# Patient Record
Sex: Female | Born: 1985 | Race: White | Hispanic: No | Marital: Married | Smoking: Never smoker
Health system: Southern US, Community
[De-identification: ages and names within clinical notes are randomized; demographics above are authoritative.]

## PROBLEM LIST (undated history)

## (undated) DIAGNOSIS — R002 Palpitations: Secondary | ICD-10-CM

## (undated) DIAGNOSIS — F410 Panic disorder [episodic paroxysmal anxiety] without agoraphobia: Secondary | ICD-10-CM

## (undated) DIAGNOSIS — D649 Anemia, unspecified: Secondary | ICD-10-CM

## (undated) HISTORY — DX: Anemia, unspecified: D64.9

## (undated) HISTORY — PX: WISDOM TOOTH EXTRACTION: SHX21

---

## 2007-12-29 ENCOUNTER — Ambulatory Visit: Payer: Self-pay | Admitting: Obstetrics and Gynecology

## 2008-06-08 IMAGING — US US OB US >=[ID] SNGL FETUS
1 series · 17 of 28 positions shown · non-contrast
Comparison: none

REASON FOR EXAM: 18 weeks pregnant, spotting
COMMENTS:

[Series 1: us ob us >=(id) sngl fetus · 17 of 43 slices shown]
[im 1/43]
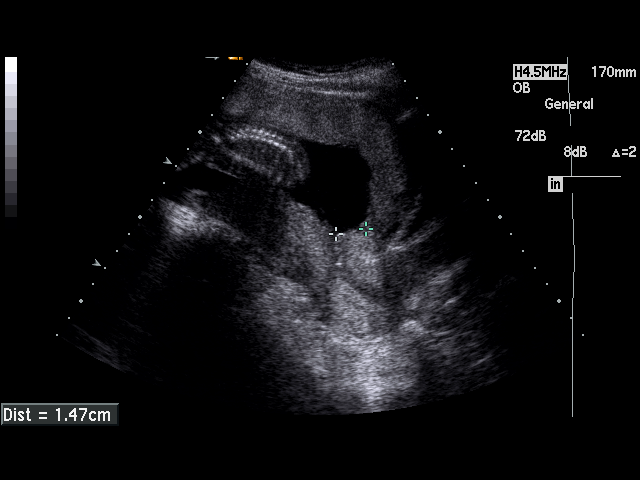
[im 4/43]
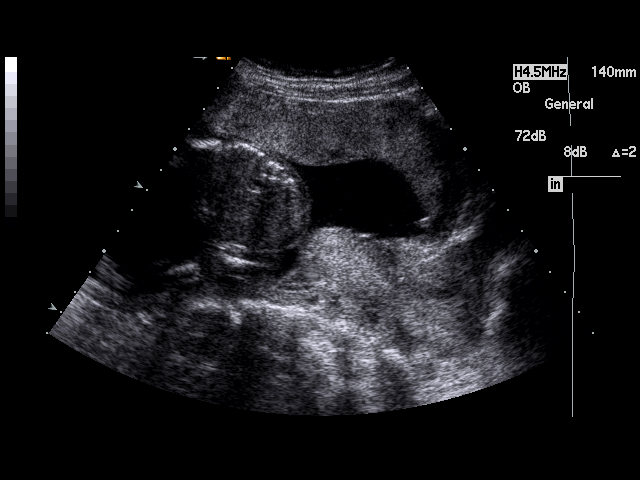
[im 7/43]
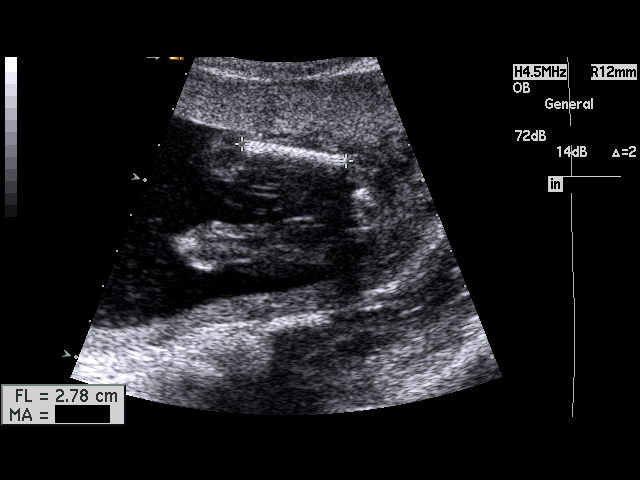
[im 8/43]
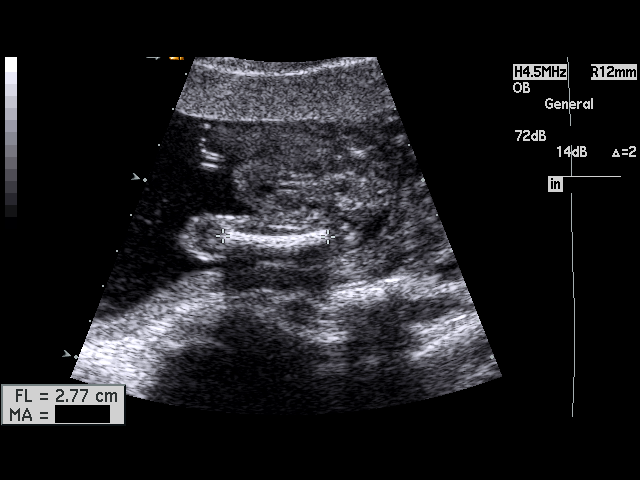
[im 11/43]
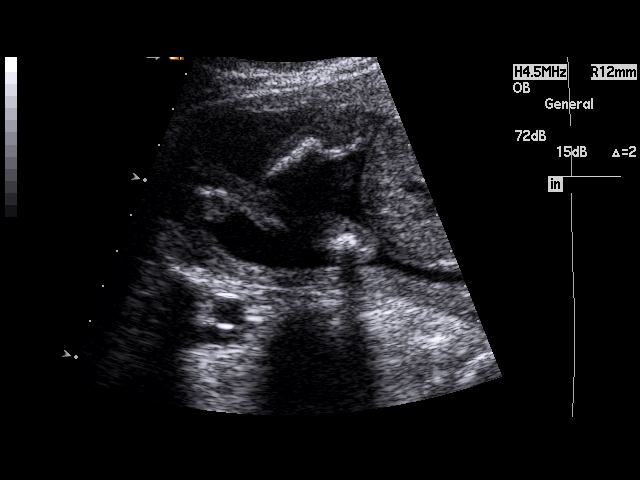
[im 15/43]
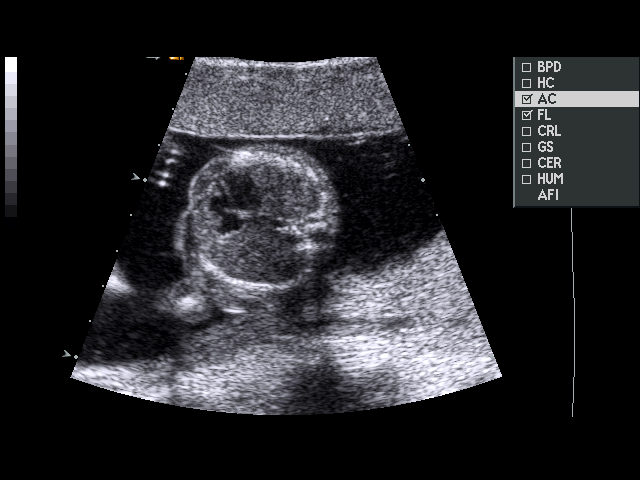
[im 16/43]
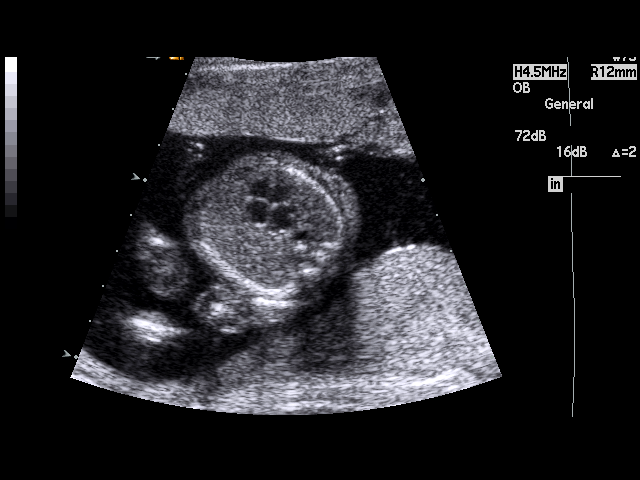
[im 19/43]
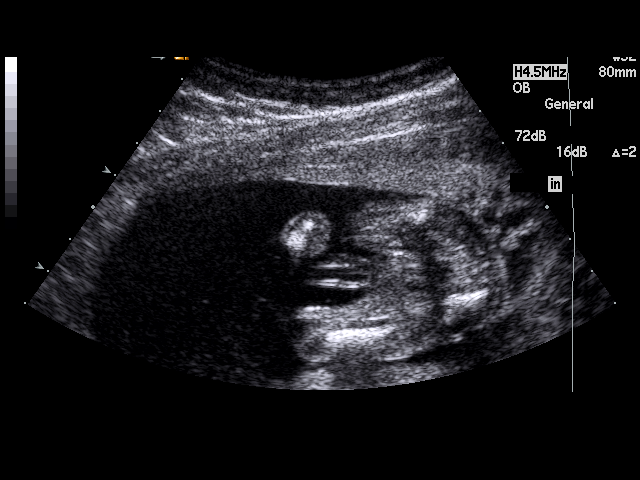
[im 22/43]
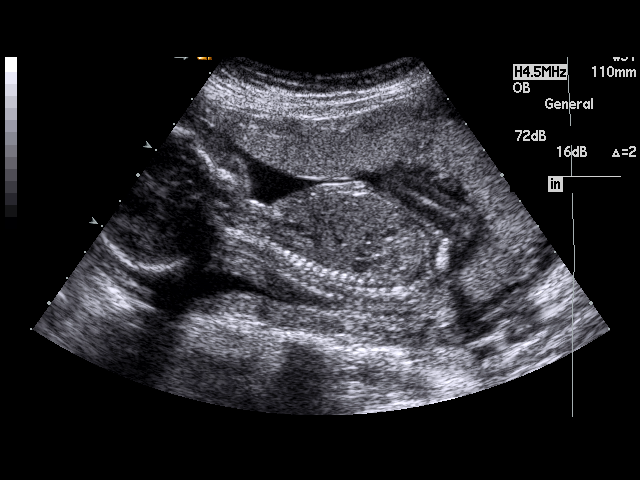
[im 24/43]
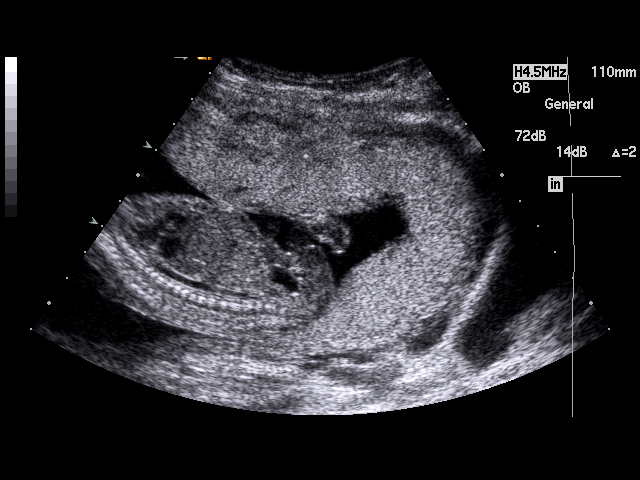
[im 27/43]
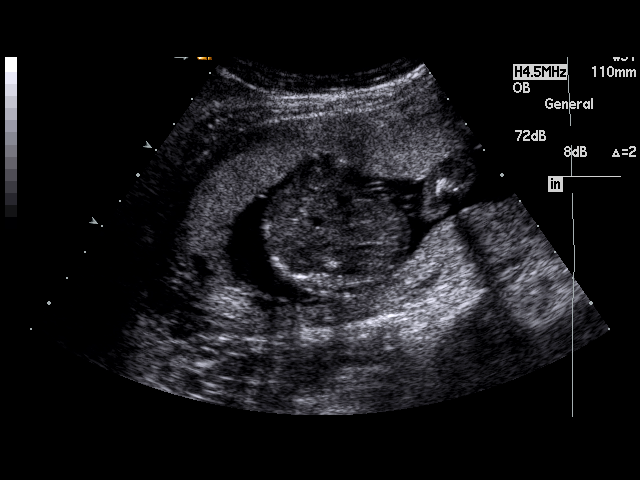
[im 29/43]
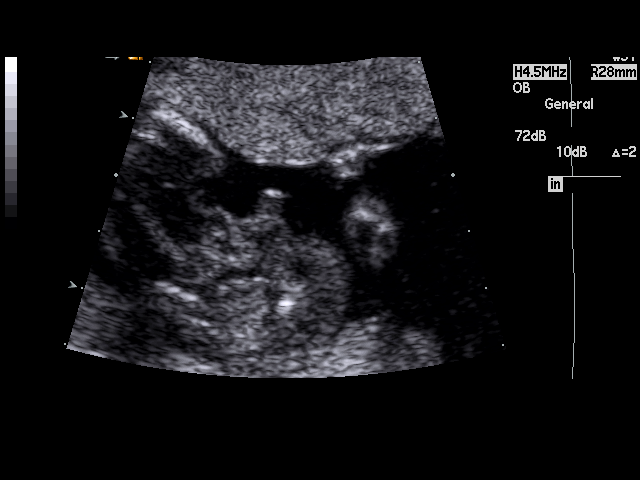
[im 32/43]
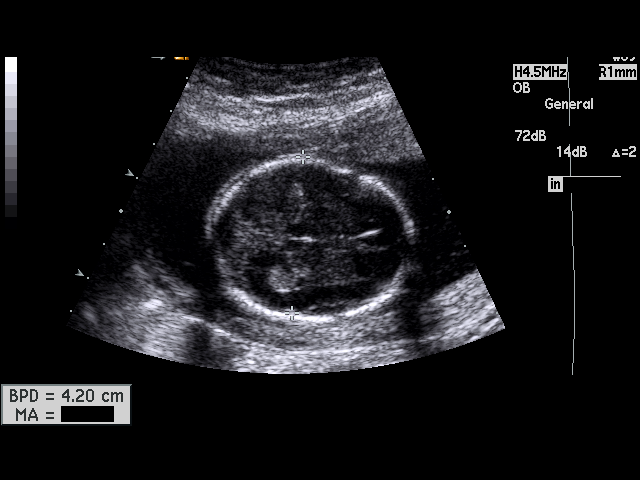
[im 35/43]
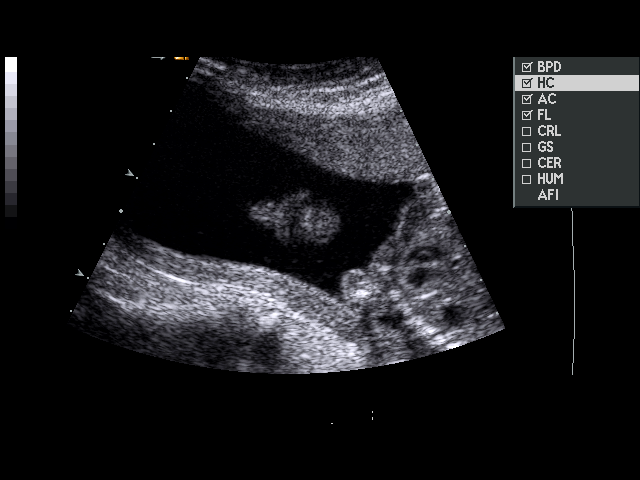
[im 36/43]
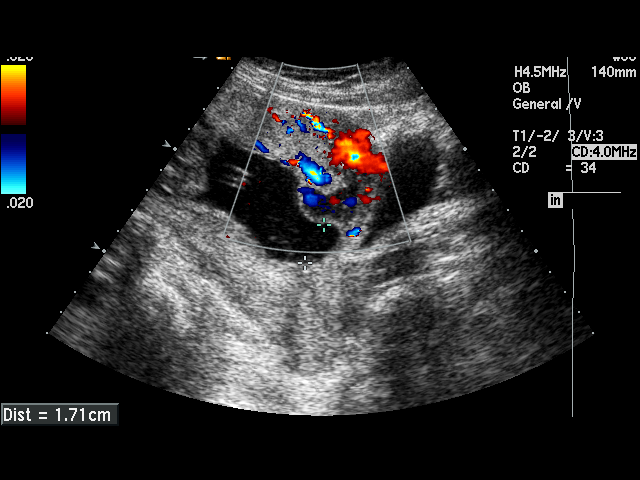
[im 39/43]
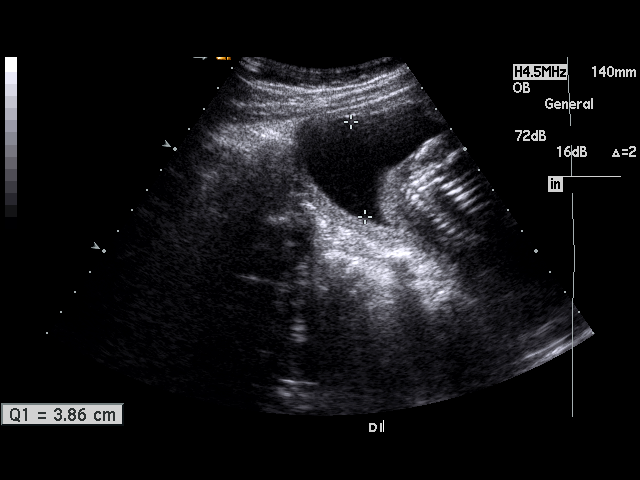
[im 43/43]
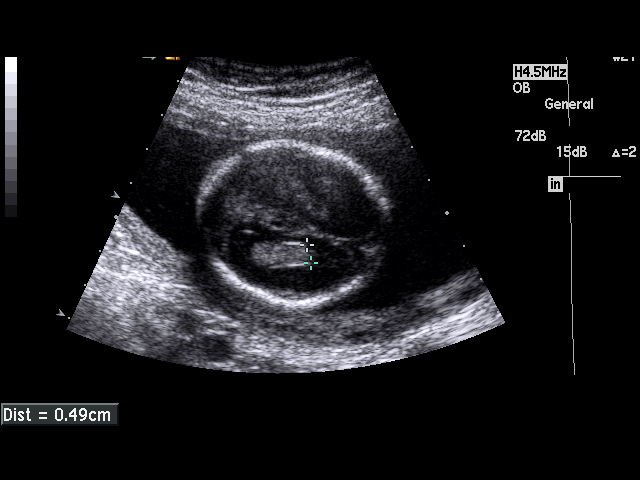

[17 of 28 positions shown; findings below may reference images not displayed]

PROCEDURE:     US  - US OB GREATER/OR EQUAL TO HXREA  - December 29, 2007 [DATE]

RESULT:     A single, viable intrauterine pregnancy is appreciated with
estimated fetal heart rate of 150 beats per minute. Cardiac, trunk,
extremity and diaphragmatic movement are appreciated. Evaluation of the
fetal anatomy demonstrates no bladder, renal, stomach, cardiac, diaphragm,
spine or ventricular abnormalities. Amniotic fluid appears unremarkable with
an index of 14.8. A Grade II placenta is appreciated which appears be
low-lying and appears to lie approximately 1.5 cm from the cervical os. No
pelvic masses, free fluid or loculated fluid collections are identified.

Fetal biopsy:

     BPD:  4.25 cm, corresponding to EGA of 18 weeks, 6 days

       HC: 15.89 cm, corresponding to EGA of 18 weeks, 5 days

       AC: 13.41 cm, corresponding to EGA of 18 weeks, 6 days

       FL:    2.27 cm, corresponding to EGA of 18 weeks, 3 days

Estimated fetal weight is 278 grams plus or minus 37 grams which corresponds
to 10 ounces plus or minus 1 ounce.
IMPRESSION: 1.     Single, viable intrauterine pregnancy as described above. There are
findings which appear to reflect the sequelae of a low lying placenta.
2.     Estimated gestational age is 18 weeks, 5 days with EDC of 05/26/2008.

## 2017-04-09 DIAGNOSIS — D509 Iron deficiency anemia, unspecified: Secondary | ICD-10-CM | POA: Insufficient documentation

## 2017-12-16 ENCOUNTER — Encounter (INDEPENDENT_AMBULATORY_CARE_PROVIDER_SITE_OTHER): Payer: Self-pay

## 2017-12-16 ENCOUNTER — Telehealth: Payer: Self-pay

## 2017-12-16 ENCOUNTER — Encounter: Payer: Self-pay | Admitting: Gastroenterology

## 2017-12-16 ENCOUNTER — Ambulatory Visit: Payer: Medicaid Other | Admitting: Gastroenterology

## 2017-12-16 ENCOUNTER — Other Ambulatory Visit: Payer: Self-pay

## 2017-12-16 VITALS — BP 118/80 | HR 75 | Temp 97.7°F | Ht 66.0 in | Wt 140.8 lb

## 2017-12-16 DIAGNOSIS — K642 Third degree hemorrhoids: Secondary | ICD-10-CM

## 2017-12-16 DIAGNOSIS — O99019 Anemia complicating pregnancy, unspecified trimester: Secondary | ICD-10-CM | POA: Insufficient documentation

## 2017-12-16 DIAGNOSIS — Z98891 History of uterine scar from previous surgery: Secondary | ICD-10-CM | POA: Insufficient documentation

## 2017-12-16 DIAGNOSIS — D508 Other iron deficiency anemias: Secondary | ICD-10-CM

## 2017-12-16 DIAGNOSIS — Z641 Problems related to multiparity: Secondary | ICD-10-CM | POA: Insufficient documentation

## 2017-12-16 DIAGNOSIS — K644 Residual hemorrhoidal skin tags: Secondary | ICD-10-CM

## 2017-12-16 MED ORDER — HYDROCORTISONE 2.5 % RE CREA: 1.0000 "application " | TOPICAL_CREAM | Freq: Two times a day (BID) | RECTAL | 1 refills | Status: DC

## 2017-12-16 MED ORDER — NONFORMULARY OR COMPOUNDED ITEM: 2 refills | Status: DC

## 2017-12-16 NOTE — Telephone Encounter (Signed)
LVM informing pt of her labs to be completed at any [facility] patient station.  She does not have to be fasting.  Thanks [employer]

## 2017-12-16 NOTE — Progress Notes (Signed)
Cephas Darby, MD [address]  [place], [place] [address]  Main: [phone]  Fax: [phone]    Gastroenterology Consultation  Referring Provider:     Dionne Ano, CNM Primary Care Physician:  Dionne Ano, CNM Primary Gastroenterologist:  Dr. Cephas Darby Reason for Consultation:     Symptomatic hemorrhoids        HPI:   Sherri Fitzgerald is a 32 y.o. female referred by Dr. Berniece Andreas, Royetta Crochet, CNM  for consultation & management of symptomatic hemorrhoids. She has history of severe iron deficiency anemia secondary to pregnancy that required iron transfusions. Her last hemoglobin was checked in 03/2017 by her OB/GYN which was 11 and ferritin was 142. She has 6 children, youngest child is an 16 months old boy. She reports that over several years, during her pregnancies she had worsening of hemorrhoids, symptoms including itching, burning, severe pain, prolapse, bleeding. She reports that her hemorrhoids prolapse every time she has a bowel movement and reduce by changing her position to Trendelenburg. Since her last pregnancy, she reports that her hemorrhoids have gotten worse. She is not planning to get pregnant anymore and decided to get her hemorrhoids taken care of. She researched about [facility] banding and found that our office offers it. She denies constipation, takes stool softeners on a regular basis and watchful about her diet. She does sitz baths, witch hazel pads, has tried over-the-counter hydrocortisone cream intermittently. She reports having one external hemorrhoid that has been extremely painful lately. 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 She otherwise denies any GI symptoms.  NSAIDs: None  Antiplts/Anticoagulants/Anti thrombotics: None  GI Procedures: None Grandmother with colon cancer at age 51 Denies any history of GI malignancy in first-degree relatives She did not have any GI surgeries  History reviewed. No pertinent past medical  history.  History reviewed. No pertinent surgical history.  Prior to Admission medications   Medication Sig Start Date End Date Taking? Authorizing Provider  PARAGARD INTRAUTERINE COPPER IUD IUD ParaGard T 380A 380 square mm intrauterine device  Take 1 device by intrauterine route.   Yes [provider]  hydrocortisone (ANUSOL-HC) 2.5 % rectal cream Place 1 application rectally 2 (two) times daily. 12/16/17   Lin Landsman, MD  NONFORMULARY OR COMPOUNDED ITEM Nitroglycerin 0.125% Ointment compounded with 5% Lidocaine Sig: apply pea size amount to the rectum 3-4 times per day as directed 12/16/17   Lin Landsman, MD    History reviewed. No pertinent family history.   Social History   Tobacco Use  . Smoking status: Never Smoker  . Smokeless tobacco: Never Used  Substance Use Topics  . Alcohol use: Yes    Frequency: Never    Comment: ocassionally  . Drug use: No    Allergies as of 12/16/2017  . (No Known Allergies)    Review of Systems:    All systems reviewed and negative except where noted in HPI.   Physical Exam:  BP 118/80   Pulse 75   Temp 97.7 F (36.5 C) (Oral)   Ht 5\' 6"  (1.676 m)   Wt 140 lb 12.8 oz (63.9 kg)   BMI 22.73 kg/m  No LMP recorded.  General:   Alert,  Well-developed, well-nourished, pleasant and cooperative in NAD Head:  Normocephalic and atraumatic. Eyes:  Sclera clear, no icterus.   Conjunctiva pink. Ears:  Normal auditory acuity. Nose:  No deformity, discharge, or lesions. Mouth:  No deformity or  lesions,oropharynx pink & moist. Neck:  Supple; no masses or thyromegaly. Lungs:  Respirations even and unlabored.  Clear throughout to auscultation.   No wheezes, crackles, or rhonchi. No acute distress. Heart:  Regular rate and rhythm; no murmurs, clicks, rubs, or gallops. Abdomen:  Normal bowel sounds. Soft, non-tender and non-distended without masses, hepatosplenomegaly or hernias noted.  No guarding or rebound tenderness.    Rectal: Perianal exam revealed skin tags and one external hemorrhoid that appeared inflamed but not thrombosed. Digital rectal exam did not reveal anal fissure. Anoscopy was performed which revealed grade 3 RA and RP internal hemorrhoid, grade 2 LL hemorrhoid. There was mucus in the rectal vault, the mucosa did not appear inflamed Msk:  Symmetrical without gross deformities. Good, equal movement & strength bilaterally. Pulses:  Normal pulses noted. Extremities:  No clubbing or edema.  No cyanosis. Neurologic:  Alert and oriented x3;  grossly normal neurologically. Skin:  Intact without significant lesions or rashes. No jaundice. Psych:  Alert and cooperative. Normal mood and affect.  Imaging Studies: None  Assessment and Plan:   Sherri Fitzgerald is a 32 y.o. White female multi parous, iron deficiency anemia secondary to multiple pregnancies status post IV iron, has been dealing with symptomatic grade 3 hemorrhoids, seen in consultation for treatment of her hemorrhoids  - I discussed with her today about [facility] hemorrhoid banding and she agreed to undergo the procedure in 3 sessions - Consent obtained - She will continue Anusol cream 2 times daily as well as 0.125 nitroglycerin +5% lidocaine as directed - Continue sitz baths daily - Recommended to use a donut while sitting to relieve pressure from the perianal area    Follow up in 2 weeks  Cephas Darby, MD

## 2017-12-16 NOTE — Progress Notes (Signed)
PROCEDURE NOTE: The patient presents with symptomatic grade 3 hemorrhoids, unresponsive to maximal medical therapy, requesting rubber band ligation of her hemorrhoidal disease.  All risks, benefits and alternative forms of therapy were described and informed consent was obtained.  In the Left Lateral Decubitus position anoscopic examination revealed grade 3 hemorrhoids in the all position(s).   The decision was made to band the RA internal hemorrhoid, and the Section was used to perform band ligation without complication.  Digital anorectal examination was then performed to assure proper positioning of the band, and to adjust the banded tissue as required.  The patient was discharged home without pain or other issues.  Dietary and behavioral recommendations were given and (if necessary - prescriptions were given), along with follow-up instructions.  The patient will return 2 weeks for follow-up and possible additional banding as required.  No complications were encountered and the patient tolerated the procedure well.   Cephas Darby, MD [address]  [place]  [place], [place] [address]  Main: [phone]  Fax: [phone] Pager: [phone]

## 2017-12-18 ENCOUNTER — Telehealth: Payer: Self-pay | Admitting: Gastroenterology

## 2017-12-18 NOTE — Telephone Encounter (Signed)
Patient returned you call and has some questions on Labs and her procedure. Please call her back.

## 2017-12-21 ENCOUNTER — Telehealth: Payer: Self-pay

## 2017-12-21 NOTE — Telephone Encounter (Signed)
Retuned pts call LVM to discuss her questions regarding labs.

## 2017-12-22 ENCOUNTER — Other Ambulatory Visit: Payer: Self-pay

## 2017-12-22 ENCOUNTER — Telehealth: Payer: Self-pay

## 2017-12-22 DIAGNOSIS — D508 Other iron deficiency anemias: Secondary | ICD-10-CM

## 2017-12-22 NOTE — Telephone Encounter (Signed)
Lab orders sent to [facility] per patient request

## 2017-12-28 ENCOUNTER — Telehealth: Payer: Self-pay | Admitting: Gastroenterology

## 2017-12-28 NOTE — Telephone Encounter (Signed)
I called & spoke to patient to let Sherri Fitzgerald know it was ok she hasn't had Sherri Fitzgerald labs,per Sharyn Lull. She would be able to discuss futher with Dr Marius Ditch at Sherri Fitzgerald appointment.

## 2017-12-28 NOTE — Telephone Encounter (Signed)
PATIENT CALLED AN WAS VERY CONCERNED.THE ORDERS FOR THE LAB WORK HAVE BEEN ENTERED,HOWEVER SHE IS SELF PAY AND DOES NOT HAVE THE MONEY AT PRESENT.SHE WOULD LIKE TO KNOW HOW VITAL IT IS TO GET THEM NOW OR CAN SHE WAIT?.PLEASE CALL PATIENT. SHE HAS A RETURN VISIT WITH DR Marius Ditch ON 01-04-18.

## 2018-01-04 ENCOUNTER — Encounter: Payer: Self-pay | Admitting: Gastroenterology

## 2018-01-04 ENCOUNTER — Ambulatory Visit (INDEPENDENT_AMBULATORY_CARE_PROVIDER_SITE_OTHER): Payer: Medicaid Other | Admitting: Gastroenterology

## 2018-01-04 VITALS — BP 105/70 | HR 66 | Temp 97.8°F | Ht 66.0 in | Wt 142.8 lb

## 2018-01-04 DIAGNOSIS — K642 Third degree hemorrhoids: Secondary | ICD-10-CM

## 2018-01-04 NOTE — Progress Notes (Signed)
PROCEDURE NOTE: The patient presents with symptomatic grade 3 hemorrhoids, unresponsive to maximal medical therapy, requesting rubber band ligation of his/her hemorrhoidal disease.  All risks, benefits and alternative forms of therapy were described and informed consent was obtained.  The decision was made to band the RP internal hemorrhoid, and the Garvin was used to perform band ligation without complication.  Digital anorectal examination was then performed to assure proper positioning of the band, and to adjust the banded tissue as required.  The patient was discharged home without pain or other issues.  Dietary and behavioral recommendations were given and (if necessary - prescriptions were given), along with follow-up instructions.  The patient will return 2 weeks for follow-up and possible additional banding as required. Initial attempt to deploy the band was unsuccessful. I have reattempted to perform ligation and deployed successfully.  No complications were encountered and the patient tolerated the procedure well.  Cephas Darby, MD [address]  [place]  [place], [place] [address]  Main: [phone]  Fax: [phone] Pager: [phone]

## 2018-01-04 NOTE — Progress Notes (Signed)
Cephas Darby, MD [address]  [place]  [place], [place] [address]  Main: [phone]  Fax: [phone]    Gastroenterology Consultation  Referring Provider:     No ref. provider found Primary Care Physician:  Dionne Ano, CNM Primary Gastroenterologist:  Dr. Cephas Darby Reason for Consultation:     Symptomatic hemorrhoids        HPI:   Sherri Fitzgerald is a 32 y.o. female referred by Dr. Berniece Andreas, Royetta Crochet, CNM  for consultation & management of symptomatic hemorrhoids. She has history of severe iron deficiency anemia secondary to pregnancy that required iron transfusions. Her last hemoglobin was checked in 03/2017 by her OB/GYN which was 11 and ferritin was 142. She has 6 children, youngest child is an 34 months old boy. She reports that over several years, during her pregnancies she had worsening of hemorrhoids, symptoms including itching, burning, severe pain, prolapse, bleeding. She reports that her hemorrhoids prolapse every time she has a bowel movement and reduce by changing her position to Trendelenburg. Since her last pregnancy, she reports that her hemorrhoids have gotten worse. She is not planning to get pregnant anymore and decided to get her hemorrhoids taken care of. She researched about [facility] banding and found that our office offers it. She denies constipation, takes stool softeners on a regular basis and watchful about her diet. She does sitz baths, witch hazel pads, has tried over-the-counter hydrocortisone cream intermittently. She reports having one external hemorrhoid that has been extremely painful lately. 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 She otherwise denies any GI symptoms.  Follow-up visit 01/04/2018: He reports feeling significantly better since first banding. Her symptoms related to hemorrhoids are significantly better. She is regularly using topical nitroglycerin and steroid which is helping. Her bowel movements are regular with fiber  supplements. She did not get labs done from last visit  NSAIDs: None  Antiplts/Anticoagulants/Anti thrombotics: None  GI Procedures: None Grandmother with colon cancer at age 34 Denies any history of GI malignancy in first-degree relatives She did not have any GI surgeries  History reviewed. No pertinent past medical history.  History reviewed. No pertinent surgical history.  Prior to Admission medications   Medication Sig Start Date End Date Taking? Authorizing Provider  PARAGARD INTRAUTERINE COPPER IUD IUD ParaGard T 380A 380 square mm intrauterine device  Take 1 device by intrauterine route.   Yes [provider]  hydrocortisone (ANUSOL-HC) 2.5 % rectal cream Place 1 application rectally 2 (two) times daily. 12/16/17   Lin Landsman, MD  NONFORMULARY OR COMPOUNDED ITEM Nitroglycerin 0.125% Ointment compounded with 5% Lidocaine Sig: apply pea size amount to the rectum 3-4 times per day as directed 12/16/17   Lin Landsman, MD    History reviewed. No pertinent family history.   Social History   Tobacco Use  . Smoking status: Never Smoker  . Smokeless tobacco: Never Used  Substance Use Topics  . Alcohol use: Yes    Frequency: Never    Comment: ocassionally  . Drug use: No    Allergies as of 01/04/2018  . (No Known Allergies)    Review of Systems:    All systems reviewed and negative except where noted in HPI.   Physical Exam:  BP 105/70   Pulse 66   Temp 97.8 F (36.6 C) (Oral)   Ht 5\' 6"  (1.676 m)   Wt 142 lb 12.8 oz (64.8 kg)   BMI 23.05  kg/m  No LMP recorded.  General:   Alert,  Well-developed, well-nourished, pleasant and cooperative in NAD Head:  Normocephalic and atraumatic. Eyes:  Sclera clear, no icterus.   Conjunctiva pink. Ears:  Normal auditory acuity. Nose:  No deformity, discharge, or lesions. Mouth:  No deformity or lesions,oropharynx pink & moist. Neck:  Supple; no masses or thyromegaly. Lungs:  Respirations even and  unlabored.  Clear throughout to auscultation.   No wheezes, crackles, or rhonchi. No acute distress. Heart:  Regular rate and rhythm; no murmurs, clicks, rubs, or gallops. Abdomen:  Normal bowel sounds. Soft, non-tender and non-distended without masses, hepatosplenomegaly or hernias noted.  No guarding or rebound tenderness.   Rectal: Perianal exam revealed skin tags and one external hemorrhoid that appeared less inflamed ompared to last visit Digital rectal exam did not reveal anal fissure.  Msk:  Symmetrical without gross deformities. Good, equal movement & strength bilaterally. Pulses:  Normal pulses noted. Extremities:  No clubbing or edema.  No cyanosis. Neurologic:  Alert and oriented x3;  grossly normal neurologically. Skin:  Intact without significant lesions or rashes. No jaundice. Psych:  Alert and cooperative. Normal mood and affect.  Imaging Studies: None  Assessment and Plan:   Sherri Fitzgerald is a 32 y.o. White female multi parous, iron deficiency anemia secondary to multiple pregnancies status post IV iron, has been dealing with symptomatic grade 3 hemorrhoids here for follow-up of treatment of her hemorrhoids  - She will continue Anusol cream 2 times daily as well as 0.125 nitroglycerin +5% lidocaine as directed - Continue sitz baths daily - Recommended to use a donut while sitting to relieve pressure from the perianal area  - continue fiber supplements - Perform hemorrhoid ligation today   Follow up in 2 weeks  Cephas Darby, MD

## 2018-01-20 ENCOUNTER — Telehealth: Payer: Self-pay | Admitting: Gastroenterology

## 2018-01-20 NOTE — Telephone Encounter (Signed)
Pt left vm for a nurse to call back she has some medical questions about her condition and wants some answers before scheduling a 3rd banding

## 2018-01-21 NOTE — Telephone Encounter (Signed)
Refer to colorectal surgery either [facility] or [facility]

## 2018-01-21 NOTE — Telephone Encounter (Signed)
Dr. Marius Ditch, patient contacted office with concerns regarding skintag and banding. Her concerns are as follows:  1. Skin tag is causing significant discomfort effecting her work life.  2. 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  She has had to adjust during the day to standing up at times due to the discomfort.  3. She has expressed the concern of being anxious about going forth with a third banding session because she had experienced such pain and discomfort after the process.    4. Also expressed that she experiences pain and discomfort in the vaginal area and radiating down her leg.  She states she feels anxious about doing another banding session, and mentioned that she thought it would take care of the external hemorrhoid not internal.  Contributes most of her pain to the skin tag (thinks maybe its on a nerve).  She has been applying NTG as prescribed.  Its not helping.  Can we do a referral to remove the skintag?  If so where would you like me to send her.  Please advise. Thanks [employer]

## 2018-01-22 NOTE — Telephone Encounter (Signed)
Patients referral was received  Yesterday afternoon.  Notified patient referral has been sent.  Spoke with Judson Roch to confirm referral was received.  She said yes and they will contact patient today.  Thanks [facility] [phone] Fax [phone]

## 2018-01-22 NOTE — Telephone Encounter (Signed)
Referral was sent yesterday at [facility] to  [facility].  Patient was notified.  Spoke with Judson Roch (New patient coordinator) this am.  Fax was received and they will contact patient today.

## 2018-03-18 ENCOUNTER — Telehealth: Payer: Self-pay | Admitting: Gastroenterology

## 2018-03-18 NOTE — Telephone Encounter (Signed)
Pt left vm to get refill on ointment rx nitrocl  She only had 2 refills and is almost done with second one please call [facility] to approve refill

## 2018-03-19 NOTE — Telephone Encounter (Signed)
LVM to notify patient her rx refill has been sent to pharmacy.  Thanks [employer]

## 2018-04-20 ENCOUNTER — Encounter: Payer: Self-pay | Admitting: Gastroenterology

## 2018-04-20 ENCOUNTER — Ambulatory Visit (INDEPENDENT_AMBULATORY_CARE_PROVIDER_SITE_OTHER): Payer: Medicaid Other | Admitting: Gastroenterology

## 2018-04-20 VITALS — BP 118/81 | HR 80 | Ht 66.0 in | Wt 142.6 lb

## 2018-04-20 DIAGNOSIS — K642 Third degree hemorrhoids: Secondary | ICD-10-CM

## 2018-04-20 NOTE — Progress Notes (Signed)
Cephas Darby, MD [address]  [place]  [place], [place] [address]  Main: [phone]  Fax: [phone]    Gastroenterology Consultation  Referring Provider:     No ref. provider found Primary Care Physician:  Dionne Ano, CNM Primary Gastroenterologist:  Dr. Cephas Darby Reason for Consultation:     Symptomatic hemorrhoids        HPI:   Sherri Fitzgerald is a 32 y.o. female referred by Dr. Berniece Andreas, Royetta Crochet, CNM  for consultation & management of symptomatic hemorrhoids. She has history of severe iron deficiency anemia secondary to pregnancy that required iron transfusions. Her last hemoglobin was checked in 03/2017 by her OB/GYN which was 11 and ferritin was 142. She has 6 children, youngest child is an 28 months old boy. She reports that over several years, since age of 32, during her pregnancies she had worsening of hemorrhoids, symptoms including itching, burning, severe pain, prolapse, bleeding. She reports that her hemorrhoids prolapse every time she has a bowel movement and reduce by changing her position to Trendelenburg. Since her last pregnancy, she reports that her hemorrhoids have gotten worse. She is not planning to get pregnant anymore and decided to get her hemorrhoids taken care of. She researched about [facility] banding and found that our office offers it. She denies constipation, takes stool softeners on a regular basis and watchful about her diet. She does sitz baths, witch hazel pads, has tried over-the-counter hydrocortisone cream intermittently. She reports having one external hemorrhoid that has been extremely painful lately. 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 She otherwise denies any GI symptoms.  Follow-up visit 01/04/2018: She reports feeling significantly better since first banding. Her symptoms related to hemorrhoids are significantly better. She is regularly using topical nitroglycerin and steroid which is helping. Her bowel movements are regular  with fiber supplements. She did not get labs done from last visit  Follow-up visit 04/20/2018 She reports ongoing symptoms related to hemorrhoids. She preferred to be seen by colorectal surgeon for removal of skin tag which she thought was extremely painful contributing to her majority of the symptoms. I referred her to [facility] surgery and he was given an option of surgical removal. She does not have insurance, is self-pay and so she wanted to wait. She is here to discuss about further banding procedure. She showed me the picture of prolapsed hemorrhoids with every bowel movement which she has to manually reduce takes at least 5-6 minutes time. She continues to apply nitroglycerin one to 2 times daily   NSAIDs: None  Antiplts/Anticoagulants/Anti thrombotics: None  GI Procedures: None Grandmother with colon cancer at age 3 Denies any history of GI malignancy in first-degree relatives She did not have any GI surgeries  History reviewed. No pertinent past medical history.  History reviewed. No pertinent surgical history.  Current Outpatient Medications:  .  hydrocortisone (ANUSOL-HC) 2.5 % rectal cream, Place 1 application rectally 2 (two) times daily., Disp: 30 g, Rfl: 1 .  NONFORMULARY OR COMPOUNDED ITEM, Nitroglycerin 0.125% Ointment compounded with 5% Lidocaine Sig: apply pea size amount to the rectum 3-4 times per day as directed, Disp: 1 each, Rfl: 2 .  PARAGARD INTRAUTERINE COPPER IUD IUD, ParaGard T 380A 380 square mm intrauterine device  Take 1 device by intrauterine route., Disp: , Rfl:   History reviewed. No pertinent family history.   Social History   Tobacco Use  . Smoking status: Never Smoker  .  Smokeless tobacco: Never Used  Substance Use Topics  . Alcohol use: Yes    Frequency: Never    Comment: ocassionally  . Drug use: No    Allergies as of 04/20/2018  . (No Known Allergies)    Review of Systems:    All systems reviewed and negative except where  noted in HPI.   Physical Exam:  BP 118/81   Pulse 80   Ht 5\' 6"  (1.676 m)   Wt 142 lb 9.6 oz (64.7 kg)   BMI 23.02 kg/m  No LMP recorded.  General:   Alert,  Well-developed, well-nourished, pleasant and cooperative in NAD Head:  Normocephalic and atraumatic. Eyes:  Sclera clear, no icterus.   Conjunctiva pink. Ears:  Normal auditory acuity. Nose:  No deformity, discharge, or lesions. Mouth:  No deformity or lesions,oropharynx pink & moist. Neck:  Supple; no masses or thyromegaly. Lungs:  Respirations even and unlabored.  Clear throughout to auscultation.   No wheezes, crackles, or rhonchi. No acute distress. Heart:  Regular rate and rhythm; no murmurs, clicks, rubs, or gallops. Abdomen:  Normal bowel sounds. Soft, non-tender and non-distended without masses, hepatosplenomegaly or hernias noted.  No guarding or rebound tenderness.   Rectal: Perianal exam revealed skin tags and one external hemorrhoid that appeared less inflamed ompared to last visit Digital rectal exam did not reveal anal fissure.  Msk:  Symmetrical without gross deformities. Good, equal movement & strength bilaterally. Pulses:  Normal pulses noted. Extremities:  No clubbing or edema.  No cyanosis. Neurologic:  Alert and oriented x3;  grossly normal neurologically. Skin:  Intact without significant lesions or rashes. No jaundice. Psych:  Alert and cooperative. Normal mood and affect.  Imaging Studies: None  Assessment and Plan:   Sherri Fitzgerald is a 32 y.o. White female multi parous, iron deficiency anemia secondary to multiple pregnancies status post IV iron, has been dealing with symptomatic grade 3 hemorrhoids since age of 32 here for follow-up of treatment of her hemorrhoids. She denies constipation, pushing or straining during BM  She reports that she is working with [facility] billing department and has applied for patient assistance program.She is optimistic that most of her medical expenses that are  involved in hemorrhoid ligation will be covered by [facility] and therefore would like to continue banding before pursuing hemorrhoidectomy. I have extensively discussed with her today that I may not be successfully treat her hemorrhoidal symptoms with hemorrhoid ligation given the severity of hemorrhoids. In fact, she will probably need more than 3 banding sessions in order to make a significant impact on her symptoms. She is willing to undergo hemorrhoid ligation and prefer surgery as a last resort. He reports feeling 25% better with previous banding procedures  - She will continue Anusol cream 2 times daily as well as 0.125 nitroglycerin +5% lidocaine as directed - Continue sitz baths daily - Recommended to use a donut while sitting to relieve pressure from the perianal area and squatty potty - continue fiber supplements - Perform hemorrhoid ligation today and every 2 weeks   Follow up in 2 weeks  Cephas Darby, MD

## 2018-04-20 NOTE — Progress Notes (Signed)
PROCEDURE NOTE: The patient presents with symptomatic grade 3 hemorrhoids, unresponsive to maximal medical therapy, requesting rubber band ligation of his/her hemorrhoidal disease.  All risks, benefits and alternative forms of therapy were described and informed consent was obtained.   The decision was made to band the LL internal hemorrhoid, and the [facility] O'Regan System was used to perform band ligation without complication.  Digital anorectal examination was then performed to assure proper positioning of the band, and to adjust the banded tissue as required.  The patient was discharged home without pain or other issues.  Dietary and behavioral recommendations were given and (if necessary - prescriptions were given), along with follow-up instructions.  The patient will return 2 weeks for follow-up and possible additional banding as required.  No complications were encountered and the patient tolerated the procedure well.  Daishawn Lauf R Josephene Marrone, MD [address]  [address]  [place], [place] [address]  Main: [phone]  Fax: [phone] Pager: [phone]     

## 2018-05-07 ENCOUNTER — Ambulatory Visit: Payer: Medicaid Other | Admitting: Gastroenterology

## 2018-05-10 ENCOUNTER — Encounter: Payer: Self-pay | Admitting: Gastroenterology

## 2018-05-10 ENCOUNTER — Ambulatory Visit (INDEPENDENT_AMBULATORY_CARE_PROVIDER_SITE_OTHER): Payer: Medicaid Other | Admitting: Gastroenterology

## 2018-05-10 VITALS — BP 113/70 | HR 67 | Resp 16 | Ht 66.0 in | Wt 142.6 lb

## 2018-05-10 DIAGNOSIS — K642 Third degree hemorrhoids: Secondary | ICD-10-CM

## 2018-05-10 NOTE — Progress Notes (Signed)
PROCEDURE NOTE: The patient presents with symptomatic grade 3 hemorrhoids, unresponsive to maximal medical therapy, requesting rubber band ligation of his/her hemorrhoidal disease.  All risks, benefits and alternative forms of therapy were described and informed consent was obtained.   The decision was made to band the RA internal hemorrhoid, and the [place] was used to perform band ligation without complication.  Digital anorectal examination was then performed to assure proper positioning of the band, and to adjust the banded tissue as required.  The patient was discharged home without pain or other issues.  Dietary and behavioral recommendations were given and (if necessary - prescriptions were given), along with follow-up instructions.  The patient will return 2 weeks for follow-up and possible additional banding as required.  No complications were encountered and the patient tolerated the procedure well.  Cephas Darby, MD [address]  [place]  [place], [place] [address]  Main: [phone]  Fax: [phone] Pager: [phone]

## 2018-05-21 ENCOUNTER — Encounter: Payer: Self-pay | Admitting: Gastroenterology

## 2018-05-21 ENCOUNTER — Ambulatory Visit (INDEPENDENT_AMBULATORY_CARE_PROVIDER_SITE_OTHER): Payer: Medicaid Other | Admitting: Gastroenterology

## 2018-05-21 VITALS — BP 120/82 | HR 73 | Resp 17 | Ht 66.0 in | Wt 142.8 lb

## 2018-05-21 DIAGNOSIS — K642 Third degree hemorrhoids: Secondary | ICD-10-CM

## 2018-05-21 NOTE — Progress Notes (Signed)
PROCEDURE NOTE: The patient presents with symptomatic grade 3 hemorrhoids, unresponsive to maximal medical therapy, requesting rubber band ligation of his/her hemorrhoidal disease.  All risks, benefits and alternative forms of therapy were described and informed consent was obtained.   The decision was made to band the RP internal hemorrhoid, and the [facility] O'Regan System was used to perform band ligation without complication.  Digital anorectal examination was then performed to assure proper positioning of the band, and to adjust the banded tissue as required.  The patient was discharged home without pain or other issues.  Dietary and behavioral recommendations were given and (if necessary - prescriptions were given), along with follow-up instructions.  The patient will return 2 weeks for follow-up and possible additional banding as required.  No complications were encountered and the patient tolerated the procedure well.  Rilea Arutyunyan R Zachery Niswander, MD [address]  [address]  [place], [place] [address]  Main: [phone]  Fax: [phone] Pager: [phone]    

## 2018-05-27 ENCOUNTER — Encounter: Payer: Self-pay | Admitting: Gastroenterology

## 2018-05-31 ENCOUNTER — Telehealth: Payer: Self-pay | Admitting: Gastroenterology

## 2018-05-31 NOTE — Telephone Encounter (Signed)
Pt left vm she states she has been sending Dr. Marius Ditch messages and is not sure if they were read or went through she states she had an issue with the last banding that was done the band came of the same day and tore a bit on her hemrroid  She has been doing sitz bath and pain rx  But she is in a lot of pain please call pt

## 2018-06-01 NOTE — Telephone Encounter (Signed)
Returned patient's call. She noticed that her bands fell off same day after procedure which was approximately 10 days ago and started experiencing severe rectal discomfort associated with pinkish discharge. She was unable to sit. She was experienced severe burning pain every time she has a bowel movement. She applied nitroglycerin and Anusol with no relief and in fact thought these may have made it worse. She reports feeling better today. She also notices that her external hemorrhoids look bigger and dark purplish.  The possibilities are, band might have been placed closer to the dentate line and she is experiencing pain or she may have thrombosed external hemorrhoid. Since, it has been 10 days since she is suffering from symptoms I'm worried that it might be too late for lancing of the thrombosed external hemorrhoid. She feels better today. She denies fever, chills, nausea, vomiting or bright red bleeding per rectum. I offered her to see general surgeon. However, she would like to wait as she started to feel better. I think this is reasonable. She will try witch hazel cream and Tucks pads, along with sitz baths  She will keep me posted about her symptoms. I have canceled her next banding sessions for now. Patient expressed understanding of the plan  Ida Uppal

## 2018-06-04 ENCOUNTER — Ambulatory Visit: Payer: Medicaid Other | Admitting: Gastroenterology

## 2018-06-15 ENCOUNTER — Telehealth: Payer: Self-pay | Admitting: Gastroenterology

## 2018-06-15 NOTE — Telephone Encounter (Signed)
LVM asking pt to return call 

## 2018-06-15 NOTE — Telephone Encounter (Signed)
Patient is still having problems from banding, please call her.

## 2018-06-18 ENCOUNTER — Ambulatory Visit: Payer: Medicaid Other | Admitting: Gastroenterology

## 2018-06-21 ENCOUNTER — Ambulatory Visit (INDEPENDENT_AMBULATORY_CARE_PROVIDER_SITE_OTHER): Payer: Medicaid Other | Admitting: Gastroenterology

## 2018-06-21 ENCOUNTER — Encounter: Payer: Self-pay | Admitting: Gastroenterology

## 2018-06-21 ENCOUNTER — Other Ambulatory Visit
Admission: RE | Admit: 2018-06-21 | Discharge: 2018-06-21 | Disposition: A | Discharge status: Home / Self Care | Payer: Medicaid Other | Source: Ambulatory Visit | Attending: Gastroenterology | Admitting: Gastroenterology

## 2018-06-21 VITALS — BP 122/81 | HR 73 | Resp 17 | Ht 66.0 in | Wt 143.6 lb

## 2018-06-21 DIAGNOSIS — K625 Hemorrhage of anus and rectum: Secondary | ICD-10-CM | POA: Insufficient documentation

## 2018-06-21 LAB — CBC
HEMATOCRIT: 32.8 % — AB (ref 35.0–47.0)
HEMOGLOBIN: 11 g/dL — AB (ref 12.0–16.0)
MCH: 28 pg (ref 26.0–34.0)
MCHC: 33.5 g/dL (ref 32.0–36.0)
MCV: 83.4 fL (ref 80.0–100.0)
PLATELETS: 275 10*3/uL (ref 150–440)
RBC: 3.93 MIL/uL (ref 3.80–5.20)
RDW: 13.6 % (ref 11.5–14.5)
WBC: 7.3 10*3/uL (ref 3.6–11.0)

## 2018-06-21 LAB — IRON AND TIBC
Iron: 27 ug/dL — ABNORMAL LOW (ref 28–170)
Saturation Ratios: 6 % — ABNORMAL LOW (ref 10.4–31.8)
TIBC: 446 ug/dL (ref 250–450)
UIBC: 419 ug/dL

## 2018-06-21 LAB — FERRITIN: FERRITIN: 5 ng/mL — AB (ref 11–307)

## 2018-06-21 NOTE — Progress Notes (Signed)
Sherri Darby, MD [address]  [place]  [place], [place] [address]  Main: [phone]  Fax: [phone]    Gastroenterology Consultation  Referring Provider:     No ref. provider found Primary Care Physician:  Dionne Ano, CNM Primary Gastroenterologist:  Dr. Cephas Fitzgerald Reason for Consultation:     Symptomatic hemorrhoids, rectal bleeding        HPI:   Sherri Fitzgerald is a 32 y.o. female referred by Dr. Berniece Andreas, Royetta Crochet, CNM  for consultation & management of symptomatic hemorrhoids. She has history of severe iron deficiency anemia secondary to pregnancy that required iron transfusions. Her last hemoglobin was checked in 03/2017 by her OB/GYN which was 11 and ferritin was 142. She has 6 children, youngest child is an 42 months old boy. She reports that over several years, since age of 77, during her pregnancies she had worsening of hemorrhoids, symptoms including itching, burning, severe pain, prolapse, bleeding. She reports that her hemorrhoids prolapse every time she has a bowel movement and reduce by changing her position to Trendelenburg. Since her last pregnancy, she reports that her hemorrhoids have gotten worse. She is not planning to get pregnant anymore and decided to get her hemorrhoids taken care of. She researched about [facility] banding and found that our office offers it. She denies constipation, takes stool softeners on a regular basis and watchful about her diet. She does sitz baths, witch hazel pads, has tried over-the-counter hydrocortisone cream intermittently. She reports having one external hemorrhoid that has been extremely painful lately. 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 She otherwise denies any GI symptoms.  Follow-up visit 01/04/2018: She reports feeling significantly better since first banding. Her symptoms related to hemorrhoids are significantly better. She is regularly using topical nitroglycerin and steroid which is helping. Her bowel  movements are regular with fiber supplements. She did not get labs done from last visit  Follow-up visit 04/20/2018 She reports ongoing symptoms related to hemorrhoids. She preferred to be seen by colorectal surgeon for removal of skin tag which she thought was extremely painful contributing to her majority of the symptoms. I referred her to [facility] surgery and he was given an option of surgical removal. She does not have insurance, is self-pay and so she wanted to wait. She is here to discuss about further banding procedure. She showed me the picture of prolapsed hemorrhoids with every bowel movement which she has to manually reduce takes at least 5-6 minutes time. She continues to apply nitroglycerin one to 2 times daily  Follow-up visit 06/21/2018 Patient reports 5 days' history of spurting blood per rectum every time she has a bowel movement which is generally around 9 PM daily. She actually shared with me the video of such episode that she recorded. She reports minimal straining, her bowels are generally soft. Bleeding stops by applying pressure. She denies these episodes during the day. She also reports rectal discomfort. She is trying to use Anusol cream but unable to apply into the anal canal due to pain. She reports feeling lightheaded. She underwent hemorrhoid ligation x 5 to date. Last one was on 05/21/2018.   NSAIDs: None  Antiplts/Anticoagulants/Anti thrombotics: None  GI Procedures: None Grandmother with colon cancer at age 15 Denies any history of GI malignancy in first-degree relatives She did not have any GI surgeries  No past medical history on file.  No past surgical history on file.  Current Outpatient Medications:  .  hydrocortisone (ANUSOL-HC) 2.5 % rectal cream, Place 1 application rectally 2 (two) times daily., Disp: 30 g, Rfl: 1 .  NONFORMULARY OR COMPOUNDED ITEM, Nitroglycerin 0.125% Ointment compounded with 5% Lidocaine Sig: apply pea size amount to the  rectum 3-4 times per day as directed, Disp: 1 each, Rfl: 2 .  PARAGARD INTRAUTERINE COPPER IUD IUD, ParaGard T 380A 380 square mm intrauterine device  Take 1 device by intrauterine route., Disp: , Rfl:   No family history on file.   Social History   Tobacco Use  . Smoking status: Never Smoker  . Smokeless tobacco: Never Used  Substance Use Topics  . Alcohol use: Yes    Frequency: Never    Comment: ocassionally  . Drug use: No    Allergies as of 06/21/2018  . (No Known Allergies)    Review of Systems:    All systems reviewed and negative except where noted in HPI.   Physical Exam:  BP 122/81 (BP Location: Left Arm, Patient Position: Sitting, Cuff Size: Normal)   Pulse 73   Resp 17   Ht 5\' 6"  (1.676 m)   Wt 143 lb 9.6 oz (65.1 kg)   BMI 23.18 kg/m  No LMP recorded.  General:   Alert,  Well-developed, well-nourished, pleasant and cooperative in NAD Head:  Normocephalic and atraumatic. Eyes:  Sclera clear, no icterus.   Conjunctiva pink. Ears:  Normal auditory acuity. Nose:  No deformity, discharge, or lesions. Mouth:  No deformity or lesions,oropharynx pink & moist. Neck:  Supple; no masses or thyromegaly. Lungs:  Respirations even and unlabored.  Clear throughout to auscultation.   No wheezes, crackles, or rhonchi. No acute distress. Heart:  Regular rate and rhythm; no murmurs, clicks, rubs, or gallops. Abdomen:  Normal bowel sounds. Soft, non-tender and non-distended without masses, hepatosplenomegaly or hernias noted.  No guarding or rebound tenderness.   Rectal: Perianal exam revealed skin tags and prolapsed external hemorrhoid, mildly tender, nonthrombosed.   Msk:  Symmetrical without gross deformities. Good, equal movement & strength bilaterally. Pulses:  Normal pulses noted. Extremities:  No clubbing or edema.  No cyanosis. Neurologic:  Alert and oriented x3;  grossly normal neurologically. Skin:  Intact without significant lesions or rashes. No jaundice. Psych:   Alert and cooperative. Normal mood and affect.  Imaging Studies: None  Assessment and Plan:   [name] is a 32 y.o. White female multiparous, iron deficiency anemia secondary to multiple pregnancies status post IV iron, has been dealing with symptomatic grade 3 hemorrhoids since age of 29 here for follow-up of treatment of her hemorrhoids. She denies constipation, pushing or straining during BM. She is status post rubber band ligation of the hemorrhoids x 5. She has been experiencing 5 days of rectal bleeding with bowel movement  She is urrently under the patient assistance program with [facility]. Based on our previous discussions, patient wished to pursue banding before undergoing hemorrhoidectomy. I had extensively discussed with her previously that I may not be successful in treating her hemorrhoidal symptoms with hemorrhoid ligation given the severity of hemorrhoids.  She now presents with 5 days' history of intermittent episodes of active bleeding from hemorrhoids refractory to hemorrhoid ligation  - I highly recommend hemorrhoidectomy at this time, will place urgent referral her to [facility] colorectal surgeon Dr. Kathline Magic - She will continue Anusol cream 2 times daily, suggested her to use glycerin suppository to apply the cream - Continue sitz baths daily - Recommend to use a donut while sitting to relieve  pressure from the perianal area and squatty potty - continue fiber - check CBC, ferritin levels today   Follow up as needed  Sherri Darby, MD

## 2018-06-23 ENCOUNTER — Encounter: Payer: Self-pay | Admitting: Gastroenterology

## 2018-06-23 ENCOUNTER — Other Ambulatory Visit: Payer: Self-pay

## 2018-06-23 DIAGNOSIS — D509 Iron deficiency anemia, unspecified: Secondary | ICD-10-CM

## 2018-06-24 ENCOUNTER — Other Ambulatory Visit: Payer: Self-pay

## 2018-06-24 ENCOUNTER — Encounter: Payer: Self-pay | Admitting: Hematology and Oncology

## 2018-06-24 ENCOUNTER — Inpatient Hospital Stay: Payer: Self-pay | Attending: Hematology and Oncology | Admitting: Hematology and Oncology

## 2018-06-24 VITALS — BP 112/76 | HR 77 | Temp 98.2°F | Resp 18 | Ht 66.0 in | Wt 140.5 lb

## 2018-06-24 DIAGNOSIS — K649 Unspecified hemorrhoids: Secondary | ICD-10-CM | POA: Insufficient documentation

## 2018-06-24 DIAGNOSIS — D509 Iron deficiency anemia, unspecified: Secondary | ICD-10-CM

## 2018-06-24 DIAGNOSIS — Z800 Family history of malignant neoplasm of digestive organs: Secondary | ICD-10-CM | POA: Insufficient documentation

## 2018-06-24 DIAGNOSIS — D62 Acute posthemorrhagic anemia: Secondary | ICD-10-CM | POA: Insufficient documentation

## 2018-06-24 DIAGNOSIS — D500 Iron deficiency anemia secondary to blood loss (chronic): Secondary | ICD-10-CM

## 2018-06-24 HISTORY — PX: HEMORROIDECTOMY: SUR656

## 2018-06-24 NOTE — Progress Notes (Signed)
[facility] day:  06/24/2018  Chief Complaint: Sherri Fitzgerald is a 32 y.o. female with iron deficiency anemia who is referred in consultation by Dr. Marius Ditch for assessment and management.  HPI:  The patient has history of severe iron deficiency anemia secondary to pregnancy.  She has required iron transfusions. She has 6 children, youngest child is an 30 months old boy. She reports that over several years, since age of 21, during her pregnancies she had worsening of hemorrhoids. She has grade 3 hemorrhoids.  Her hemorrhoids prolapse every time she has a bowel movement and reduce by changing her position to Trendelenburg.  Since her last pregnancy, her hemorrhoids have gotten worse.   She has had hemorrhoid banding x 5 (last 05/21/2018).    She was seen in follow-up by Dr. Marius Ditch on 06/21/2018.  She reported a 5 day history of spurting blood per rectum every time she has a bowel movement. Bleeding stops by applying pressure. She was trying to use Anusol cream but unable to apply into the anal canal due to pain. She was lightheaded.   Recommendation was for hemorrhoidectomy.  An urgent referral her to [facility] colorectal surgeon Dr. Kathline Magic was placed.  She was to continue Anusol cream 2 times daily and glycerin suppository to apply the cream .  She was to continue sitz baths daily.  A donut while sitting to relieve pressure from the perianal area and squatty potty was recommended as well as fiber.  CBC on 06/21/2018 revealed a hematocrit of 32.8, hemoglobin 11.0, MCV 83.4, platelets 275,000, and WBC 7300.  Ferritin was 5 with an iron saturation of 6% and a TIBC of 446.  Review of prior CBC reveals the following: 04/09/2017:  Hematocrit 29.6, hemoglobin 8.7, and MCV 72.  Ferritin was 6.  Iron saturation was 3% with a TIBC of 641. 04/23/2018:  Hematocrit 36.0, hemoglobin 11.0, and MCV 76.  Ferritin was 142 on 04/14/2018.  Patient is a G61P6 (children are  ages 35, 67, 17, 41, 75, and 1).  Patient presents with marked fatigued. She has some slight exertional dyspnea and intermittent palpitations. Patient notes continued hemorrhoidal bleeding. Patient has an anal fissure that "sprays" when she has a bowel movement. She notes significant bleeding with every bowel movement that she has. She has a planned sigmoidoscopy scheduled for 06/25/2018 with Dr. Marius Ditch.   She notes heavy menses that last for 5-7 days. Patient changes tampons every 1-2 hours. Patient maintains a diet rich in iron. She indicates that she eats meat and green leafy vegetables on a consistent basis. She has had ice pica in the past, but none at this time. Patient has significant restless leg symptoms. She has never required a blood transfusion. She has received intravenous iron replacement at [facility]. In review of the available records, patient was given Venofer 500 mg IV with APAP 650 mg, diphenhydramine 25 IV, and famotidine 20 mg IV x 2 doses (04/09/2017 and 04/14/2017).  Grandmother had colon cancer at age 60. Mother and sister both have a history of iron deficiency anemia.   History reviewed. No pertinent past medical history.    Past Surgical History:  Procedure Laterality Date  . CESAREAN SECTION  2006    Family History  Problem Relation Age of Onset  . Cancer Paternal Grandmother     Social History:  reports that she has never smoked. She has never used smokeless tobacco. She reports that she drinks alcohol. She reports that  she does not use drugs.  She has 6 children (ages 39, 32, 69, 46, 47, and 1).  She lives in [place], [place].  The patient is alone today.  Allergies: No Known Allergies  Current Medications: Current Outpatient Medications  Medication Sig Dispense Refill  . hydrocortisone (ANUSOL-HC) 2.5 % rectal cream Place 1 application rectally 2 (two) times daily. 30 g 1  . NONFORMULARY OR COMPOUNDED ITEM Nitroglycerin 0.125% Ointment compounded with 5% Lidocaine Sig:  apply pea size amount to the rectum 3-4 times per day as directed 1 each 2  . PARAGARD INTRAUTERINE COPPER IUD IUD ParaGard T 380A 380 square mm intrauterine device  Take 1 device by intrauterine route.     No current facility-administered medications for this visit.     Review of Systems  Constitutional: Positive for malaise/fatigue (marked). Negative for diaphoresis, fever and weight loss.  HENT: Negative.   Eyes: Negative.   Respiratory: Positive for shortness of breath (exertional). Negative for cough, hemoptysis and sputum production.   Cardiovascular: Positive for palpitations. Negative for chest pain, orthopnea, leg swelling and PND.  Gastrointestinal: Positive for blood in stool (heavy hemmorhoidal bleeding.  Hemmorrrhoid "sprays" with bowel movements.  (+) known anal fissure). Negative for abdominal pain, constipation, diarrhea, melena, nausea and vomiting.  Genitourinary: Negative for dysuria, frequency, hematuria and urgency.       Heavy menses  Musculoskeletal: Negative for back pain, falls, joint pain and myalgias.  Skin: Negative for itching and rash.  Neurological: Positive for dizziness (intermittent). Negative for tremors, weakness and headaches.  Endo/Heme/Allergies: Does not bruise/bleed easily.  Psychiatric/Behavioral: Negative for depression, memory loss and suicidal ideas. The patient is not nervous/anxious and does not have insomnia.        Increased stress related to health status. Has 6 children ranging from ages 1-13.   All other systems reviewed and are negative.  Performance status (ECOG): 1 - Symptomatic but completely ambulatory  Vital Signs BP 112/76 (BP Location: Left Arm, Patient Position: Sitting)   Pulse 77   Temp 98.2 F (36.8 C)   Resp 18   Ht 5\' 6"  (1.676 m)   Wt 140 lb 8 oz (63.7 kg)   BMI 22.68 kg/m   Physical Exam  Constitutional: She is oriented to person, place, and time and well-developed, well-nourished, and in no distress.  HENT:   Head: Normocephalic and atraumatic.  Blonde hair  Eyes: Pupils are equal, round, and reactive to light. EOM are normal. No scleral icterus.  Blue eyes.   Neck: Normal range of motion. Neck supple. No tracheal deviation present. No thyromegaly present.  Cardiovascular: Normal rate, regular rhythm and normal heart sounds. Exam reveals no gallop and no friction rub.  No murmur heard. Pulmonary/Chest: Effort normal and breath sounds normal. No respiratory distress. She has no wheezes. She has no rales.  Abdominal: Soft. Bowel sounds are normal. She exhibits no distension. There is no tenderness.  Genitourinary:  Genitourinary Comments: DRE deferred; being seen by GI for known hemorrhoids  Musculoskeletal: Normal range of motion. She exhibits no edema or tenderness.  Lymphadenopathy:    She has no cervical adenopathy.    She has no axillary adenopathy.       Right: No inguinal and no supraclavicular adenopathy present.       Left: No inguinal and no supraclavicular adenopathy present.  Neurological: She is alert and oriented to person, place, and time.  Skin: Skin is warm and dry. No rash noted. No erythema.  Psychiatric: Mood, affect  and judgment normal.  Nursing note and vitals reviewed.   No visits with results within 3 Day(s) from this visit.  Latest known visit with results is:  Hospital Outpatient Visit on 06/21/2018  Component Date Value Ref Range Status  . Iron 06/21/2018 27* 28 - 170 ug/dL Final  . TIBC 06/21/2018 446  250 - 450 ug/dL Final  . Saturation Ratios 06/21/2018 6* 10.4 - 31.8 % Final  . UIBC 06/21/2018 419  ug/dL Final   Performed at [facility], [address] [place], [place] [address]  . Ferritin 06/21/2018 5* 11 - 307 ng/mL Final   Performed at [facility], [address] [place], [place] [address]  . WBC 06/21/2018 7.3  3.6 - 11.0 K/uL Final  . RBC 06/21/2018 3.93  3.80 - 5.20 MIL/uL Final  . Hemoglobin 06/21/2018 11.0* 12.0 - 16.0  g/dL Final  . HCT 06/21/2018 32.8* 35.0 - 47.0 % Final  . MCV 06/21/2018 83.4  80.0 - 100.0 fL Final  . MCH 06/21/2018 28.0  26.0 - 34.0 pg Final  . MCHC 06/21/2018 33.5  32.0 - 36.0 g/dL Final  . RDW 06/21/2018 13.6  11.5 - 14.5 % Final  . Platelets 06/21/2018 275  150 - 440 K/uL Final   Performed at [facility], [address] [place], [place] [address]    Assessment:  Sherri Fitzgerald is a 32 y.o. female with iron deficiency anemia secondary to multiple pregnancies and symptomatic bleeding hemorrhoids.  She has had grade 3 hemorrhoids since age of 4.  She is status post rubber band ligation of the hemorrhoids x 5.   She has received IV iron in the past at [facility] - Venofer 500 mg IV with APAP 650 mg, diphenhydramine 25 IV, and famotidine 20 mg IV x 2 doses (04/09/2017 and 04/14/2017).  Ferritin has been followed.  6 on 04/09/2017, 142 on 04/23/2017, and 5 on 06/21/2018.  Symptomatically, she is markedly fatigued. She experiences heavy hemorrhoidal bleeding with bowel movements. Her hemorrhoids "spray" with increase rectal pressure during bowel movements. She has provided a video to document (see media tab; 06/21/2018 entry)  Plan: 1. Iron deficiency related to acute blood loss - severe  Acute blood loss related to severe hemorrhoidal bleeding. She is symptomatic (fatigied, short of breath, and dizzy).   Scheduled for sigmoidoscopy on 06/25/2018 with Dr. Marius Ditch.  Last ferritin was 5 on 06/21/2018. Will need weekly Venofer 200 mg IV x 4 infusions.  2. Hemorrhoids - severe  Follow up with GI regarding appropriate referral to colorectal surgery. Surgeon she was initially set up with cannot seen her for over a month, which is not in the best this patient's best interest given the degree to which she is symptomatic and losing blood.  3. RTC on 06/28/2018 for labs (CBC with diff, CMP, B12, folate, ferritin, iron studies, TSH, retic), and Venofer 200 mg IV.  4. RTC weekly for Venofer  x 3.  5. RTC in 6 weeks for labs (CBC with diff, ferritin). 6. RTC in 3 months for MD assessment, labs (CBC with diff, ferritin - day before) and +/- Venofer.    Honor Loh, NP  06/24/2018, 4:30 PM   I saw and evaluated the patient, participating in the key portions of the service and reviewing pertinent diagnostic studies and records.  I reviewed the nurse practitioner's note and agree with the findings and the plan.  The assessment and plan were discussed with the patient.  Several questions were asked  by the patient and answered.   Nolon Stalls, MD 06/24/2018, 4:30 PM

## 2018-06-24 NOTE — Progress Notes (Signed)
Patient here today as new evaluation regarding iron deficiency anemia.  Referred by Dr. Marius Ditch.  Patient states she has always had hemorrhoids but  has had bleeding for the past week.  Dr. Marius Ditch had tried banding but it has not helped.  Patient is most likely going to have to undergo surgery but is unable to due to low ferritin.  Last lab 06-21-18 and ferritin was 5.

## 2018-06-25 ENCOUNTER — Ambulatory Visit
Admission: RE | Admit: 2018-06-25 | Discharge: 2018-06-25 | Disposition: A | Discharge status: Home / Self Care | Payer: Self-pay | Source: Ambulatory Visit | Attending: Gastroenterology | Admitting: Gastroenterology

## 2018-06-25 ENCOUNTER — Encounter
Admission: RE | Disposition: A | Discharge status: Home / Self Care | Payer: Self-pay | Source: Ambulatory Visit | Attending: Gastroenterology

## 2018-06-25 ENCOUNTER — Ambulatory Visit: Payer: Self-pay | Admitting: Anesthesiology

## 2018-06-25 ENCOUNTER — Encounter: Payer: Self-pay | Admitting: Anesthesiology

## 2018-06-25 DIAGNOSIS — K642 Third degree hemorrhoids: Secondary | ICD-10-CM | POA: Insufficient documentation

## 2018-06-25 DIAGNOSIS — K625 Hemorrhage of anus and rectum: Secondary | ICD-10-CM | POA: Insufficient documentation

## 2018-06-25 HISTORY — PX: FLEXIBLE SIGMOIDOSCOPY: SHX5431

## 2018-06-25 LAB — FOLATE: FOLATE: 41 ng/mL (ref >5.9)

## 2018-06-25 LAB — CBC
HCT: 27.6 % — ABNORMAL LOW (ref 35.0–47.0)
HEMOGLOBIN: 9.2 g/dL — AB (ref 12.0–16.0)
MCH: 27.6 pg (ref 26.0–34.0)
MCHC: 33.4 g/dL (ref 32.0–36.0)
MCV: 82.8 fL (ref 80.0–100.0)
PLATELETS: 245 10*3/uL (ref 150–440)
RBC: 3.34 MIL/uL — AB (ref 3.80–5.20)
RDW: 13.8 % (ref 11.5–14.5)
WBC: 5 10*3/uL (ref 3.6–11.0)

## 2018-06-25 LAB — VITAMIN B12: Vitamin B-12: 390 pg/mL (ref 180–914)

## 2018-06-25 SURGERY — SIGMOIDOSCOPY, FLEXIBLE
Anesthesia: General

## 2018-06-25 MED ORDER — SODIUM CHLORIDE 0.9 % IV SOLN
INTRAVENOUS | Status: DC
  Administered 2018-06-25: 1000 mL via INTRAVENOUS

## 2018-06-25 MED ORDER — LIDOCAINE HCL (PF) 1 % IJ SOLN
2.0000 mL | Freq: Once | INTRAMUSCULAR | Status: AC
  Administered 2018-06-25: 0.3 mL via INTRADERMAL

## 2018-06-25 MED ORDER — PROPOFOL 500 MG/50ML IV EMUL
INTRAVENOUS | Status: AC
  Filled 2018-06-25: qty 50

## 2018-06-25 MED ORDER — PROPOFOL 500 MG/50ML IV EMUL
INTRAVENOUS | Status: DC | PRN
  Administered 2018-06-25: 100 ug/kg/min via INTRAVENOUS

## 2018-06-25 MED ORDER — PROPOFOL 10 MG/ML IV BOLUS
INTRAVENOUS | Status: DC | PRN
  Administered 2018-06-25 (×3): 30 mg via INTRAVENOUS

## 2018-06-25 MED ORDER — LIDOCAINE HCL (PF) 1 % IJ SOLN
INTRAMUSCULAR | Status: AC
  Administered 2018-06-25: 0.3 mL via INTRADERMAL
  Filled 2018-06-25: qty 2

## 2018-06-25 NOTE — Anesthesia Post-op Follow-up Note (Signed)
Anesthesia QCDR form completed.        

## 2018-06-25 NOTE — Anesthesia Postprocedure Evaluation (Signed)
Anesthesia Post Note  Patient: Sherri Fitzgerald  Procedure(s) Performed: FLEXIBLE SIGMOIDOSCOPY (N/A )  Anesthesia Type: General Level of consciousness: awake and alert and oriented Pain management: pain level controlled Vital Signs Assessment: post-procedure vital signs reviewed and stable Respiratory status: spontaneous breathing Cardiovascular status: blood pressure returned to baseline Anesthetic complications: no     Last Vitals:  Vitals:   06/25/18 0711 06/25/18 0811  BP: 115/78 107/69  Pulse: 81 99  Resp: 17 12  Temp: (!) 35.9 C (!) 36.3 C  SpO2: 100% 100%    Last Pain:  Vitals:   06/25/18 0811  TempSrc: Tympanic  PainSc: 0-No pain                 Gaetan Spieker

## 2018-06-25 NOTE — Anesthesia Preprocedure Evaluation (Signed)
Anesthesia Evaluation  Patient identified by MRN, date of birth, ID band Patient awake    Reviewed: Allergy & Precautions, NPO status , Patient's Chart, lab work & pertinent test results  Airway Mallampati: II  TM Distance: >3 FB     Dental  (+) Teeth Intact   Pulmonary neg pulmonary ROS,    Pulmonary exam normal        Cardiovascular negative cardio ROS Normal cardiovascular exam     Neuro/Psych negative neurological ROS  negative psych ROS   GI/Hepatic negative GI ROS, Neg liver ROS,   Endo/Other  negative endocrine ROS  Renal/GU negative Renal ROS  negative genitourinary   Musculoskeletal   Abdominal Normal abdominal exam  (+)   Peds negative pediatric ROS (+)  Hematology  (+) anemia ,   Anesthesia Other Findings   Reproductive/Obstetrics                             Anesthesia Physical Anesthesia Plan  ASA: I  Anesthesia Plan: General   Post-op Pain Management:    Induction: Intravenous  PONV Risk Score and Plan: TIVA  Airway Management Planned:   Additional Equipment:   Intra-op Plan:   Post-operative Plan:   Informed Consent: I have reviewed the patients History and Physical, chart, labs and discussed the procedure including the risks, benefits and alternatives for the proposed anesthesia with the patient or authorized representative who has indicated his/her understanding and acceptance.   Dental advisory given  Plan Discussed with: CRNA and Surgeon  Anesthesia Plan Comments:         Anesthesia Quick Evaluation

## 2018-06-25 NOTE — Transfer of Care (Signed)
Immediate Anesthesia Transfer of Care Note  Patient: Sherri Fitzgerald  Procedure(s) Performed: FLEXIBLE SIGMOIDOSCOPY (N/A )  Patient Location: PACU  Anesthesia Type:General  Level of Consciousness: awake, alert  and oriented  Airway & Oxygen Therapy: Patient Spontanous Breathing  Post-op Assessment: Report given to RN  Post vital signs: stable  Last Vitals:  Vitals Value Taken Time  BP 107/69   Temp 96.3   Pulse 91107/69   Resp    SpO2 100     Last Pain:  Vitals:   06/25/18 0711  TempSrc: Tympanic         Complications: No apparent anesthesia complications

## 2018-06-25 NOTE — H&P (Signed)
Cephas Darby, MD [address]  [place]  [place], [place] [address]  Main: [phone]  Fax: [phone] Pager: [phone]  Primary Care Physician:  Dionne Ano, CNM Primary Gastroenterologist:  Dr. Cephas Darby  Pre-Procedure History & Physical: HPI:  Sherri Fitzgerald is a 32 y.o. female is here for an flexible sigmoidoscopy.   History reviewed. No pertinent past medical history.  Past Surgical History:  Procedure Laterality Date  . CESAREAN SECTION  2006    Prior to Admission medications   Medication Sig Start Date End Date Taking? Authorizing Provider  hydrocortisone (ANUSOL-HC) 2.5 % rectal cream Place 1 application rectally 2 (two) times daily. 12/16/17   Lin Landsman, MD  NONFORMULARY OR COMPOUNDED ITEM Nitroglycerin 0.125% Ointment compounded with 5% Lidocaine Sig: apply pea size amount to the rectum 3-4 times per day as directed 12/16/17   Lin Landsman, MD  [facility] INTRAUTERINE COPPER IUD IUD ParaGard T 380A 380 square mm intrauterine device  Take 1 device by intrauterine route.    [provider]    Allergies as of 06/24/2018  . (No Known Allergies)    Family History  Problem Relation Age of Onset  . Cancer Paternal Grandmother     Social History   Socioeconomic History  . Marital status: Married    Spouse name: Not on file  . Number of children: Not on file  . Years of education: Not on file  . Highest education level: Not on file  Occupational History  . Not on file  Social Needs  . Financial resource strain: Not on file  . Food insecurity:    Worry: Not on file    Inability: Not on file  . Transportation needs:    Medical: Not on file    Non-medical: Not on file  Tobacco Use  . Smoking status: Never Smoker  . Smokeless tobacco: Never Used  Substance and Sexual Activity  . Alcohol use: Yes    Frequency: Never    Comment: ocassionally  . Drug use: No  . Sexual activity: Yes    Birth control/protection:  IUD  Lifestyle  . Physical activity:    Days per week: Not on file    Minutes per session: Not on file  . Stress: Not on file  Relationships  . Social connections:    Talks on phone: Not on file    Gets together: Not on file    Attends religious service: Not on file    Active member of club or organization: Not on file    Attends meetings of clubs or organizations: Not on file    Relationship status: Not on file  . Intimate partner violence:    Fear of current or ex partner: Not on file    Emotionally abused: Not on file    Physically abused: Not on file    Forced sexual activity: Not on file  Other Topics Concern  . Not on file  Social History Narrative  . Not on file    Review of Systems: See HPI, otherwise negative ROS  Physical Exam: BP 115/78   Pulse 81   Temp (!) 96.7 F (35.9 C) (Tympanic)   Resp 17   Ht 5\' 6"  (1.676 m)   Wt 143 lb (64.9 kg)   SpO2 100%   BMI 23.08 kg/m  General:   Alert,  pleasant and cooperative in NAD Head:  Normocephalic and atraumatic. Neck:  Supple; no masses or thyromegaly. Lungs:  Clear  throughout to auscultation.    Heart:  Regular rate and rhythm. Abdomen:  Soft, nontender and nondistended. Normal bowel sounds, without guarding, and without rebound.   Neurologic:  Alert and  oriented x4;  grossly normal neurologically.  Impression/Plan: Sherri Fitzgerald is here for an flexible sigmoidoscopy to be performed for rectal bleeding  Risks, benefits, limitations, and alternatives regarding  flexible sigmoidoscopy have been reviewed with the patient.  Questions have been answered.  All parties agreeable.   Sherri Sear, MD  06/25/2018, 7:42 AM

## 2018-06-25 NOTE — Op Note (Signed)
[facility] Patient Name: Sherri Fitzgerald Procedure Date: 06/25/2018 7:52 AM MRN: [id number] Account #: [id number] Date of Birth: 16-Apr-1986 Admit Type: Outpatient Age: 32 Room: [facility] ENDO ROOM 2 Gender: Female Note Status: Finalized Procedure:            Flexible Sigmoidoscopy Indications:          Rectal hemorrhage Providers:            Lin Landsman MD, MD Medicines:            Monitored Anesthesia Care Complications:        No immediate complications. Estimated blood loss: None. Procedure:            Pre-Anesthesia Assessment:                       - Prior to the procedure, a History and Physical was                        performed, and patient medications and allergies were                        reviewed. The patient is competent. The risks and                        benefits of the procedure and the sedation options and                        risks were discussed with the patient. All questions                        were answered and informed consent was obtained.                        Patient identification and proposed procedure were                        verified by the physician, the nurse, the                        anesthesiologist, the anesthetist and the technician in                        the pre-procedure area in the procedure room in the                        endoscopy suite. Mental Status Examination: alert and                        oriented. Airway Examination: normal oropharyngeal                        airway and neck mobility. Respiratory Examination:                        clear to auscultation. CV Examination: normal.                        Prophylactic Antibiotics: The patient does not require  prophylactic antibiotics. Prior Anticoagulants: The                        patient has taken no previous anticoagulant or                        antiplatelet agents. ASA Grade Assessment: II - A                     patient with mild systemic disease. After reviewing the                        risks and benefits, the patient was deemed in                        satisfactory condition to undergo the procedure. The                        anesthesia plan was to use monitored anesthesia care                        (MAC). Immediately prior to administration of                        medications, the patient was re-assessed for adequacy                        to receive sedatives. The heart rate, respiratory rate,                        oxygen saturations, blood pressure, adequacy of                        pulmonary ventilation, and response to care were                        monitored throughout the procedure. The physical status                        of the patient was re-assessed after the procedure.                       After obtaining informed consent, the scope was passed                        under direct vision. The Endoscope was introduced                        through the anus and advanced to the the descending                        colon. The flexible sigmoidoscopy was accomplished                        without difficulty. The patient tolerated the procedure                        fairly well. The quality of the bowel preparation was  adequate. Findings:      Hemorrhoids were found on perianal exam.      Non-bleeding external and internal hemorrhoids were found during       retroflexion. The hemorrhoids were large and Grade III (internal       hemorrhoids that prolapse but require manual reduction) with stigmata of       recent bleeding.      Normal mucosa was found in the rectum, in the recto-sigmoid colon, in       the sigmoid colon and in the descending colon.      Areas of post banding scar were found in the rectum. The scar tissue was       healthy in appearance. Impression:           - Hemorrhoids found on perianal exam.                        - Non-bleeding large external and internal hemorrhoids.                       - Normal mucosa in the rectum, in the recto-sigmoid                        colon, in the sigmoid colon and in the descending colon.                       - No specimens collected. Recommendation:       - Discharge patient to home (with escort) or admit                        based on her CBC.                       - Full liquid diet.                       - Check CBC, start IV iron                       - Recommend hemorrhoidectomy, urgent surgery referral                        placed Procedure Code(s):    --- Professional ---                       [id number], Sigmoidoscopy, flexible; diagnostic, including                        collection of specimen(s) by brushing or washing, when                        performed (separate procedure) Diagnosis Code(s):    --- Professional ---                       K64.2, Third degree hemorrhoids                       K62.5, Hemorrhage of anus and rectum CPT copyright 2017 American Medical Association. All rights reserved. The codes documented in this report are preliminary and upon coder review may  be revised to meet current compliance requirements. Dr. Ulyess Mort Davidlee Jeanbaptiste Raeanne Gathers  MD, MD 06/25/2018 8:19:14 AM This report has been signed electronically. Number of Addenda: 0 Note Initiated On: 06/25/2018 7:52 AM Total Procedure Duration: 0 hours 4 minutes 46 seconds       [facility]

## 2018-06-26 ENCOUNTER — Encounter: Payer: Self-pay | Admitting: Urgent Care

## 2018-06-26 ENCOUNTER — Encounter: Payer: Self-pay | Admitting: Hematology and Oncology

## 2018-06-26 ENCOUNTER — Telehealth: Payer: Self-pay | Admitting: Urgent Care

## 2018-06-26 NOTE — Telephone Encounter (Addendum)
Re: Plans for treatment  Reached out to patient on the morning of 06/26/2018 to determine her plans for treatment. I received a message yesterday asking for all of her appointments to be canceled.  In review of her medical record, I noted that the patient had been seen in the ED at [facility].  Following her sigmoidoscopy on 06/25/2018, patient became extremely dizzy and "woozy" to the point where she felt presyncopal.  Patient was advised to go to the ED for further evaluation.  Patient was seen in the ED at [facility] on 06/25/2018.  Notes reviewed.  WBC 5400.  Hemoglobin 9.6, hematocrit 29.8, MCV 85.1, and platelets 309,000.  There was market hyperchromasia noted on the CBC differential.  Reticulocytes were 3.50.  Calcium low at 8.4.  Ferritin low at 5.8.  INR was WNL at 1.07.  Patient was seen by surgery during her time in the ER.  She was subsequently scheduled for urgent hemorrhoidectomy on 06/28/2018 at [facility].  In speaking with the patient, she reluctantly electing to cancel all appointments with [facility] at this time.  Patient will undergo surgical procedure on 06/28/2018 as previously mentioned.  Patient to contact the hematology clinic here at [facility] when she is ready to reschedule her iron infusions.  She understands that intravenous iron replacement will improve her blood counts, and subsequently her energy level.  She is unsure at this time when she would like to begin, citing the fact that she would like to recover from surgery.  All appointments that were previously scheduled here at [facility] have been canceled per the patient's request.  I have updated the patient's primary attending physician Mike Gip, MD).  Patient encouraged to return to call to the clinic with any questions or concerns.  She was asked to call me directly when she is ready to reschedule her appointments, at which time I will make every effort to get her in for her much-needed Venofer infusions.  She is aware that her first infusion will be  preceded by lab draw.  She will receive Venofer 200 mg weekly x4 upon rescheduling.  Patient will then come back in 6 weeks for routine lab monitoring and additional iron if needed.  We will plan on seeing her in follow-up clinic in 3 months, or sooner if needed.  Patient very appreciative of the communication from her hematology/oncology team.   Honor Loh, MSN, APRN, FNP-C, CEN Oncology/Hematology Nurse Practitioner  [facility] 06/26/18, 4:22 PM

## 2018-06-28 ENCOUNTER — Inpatient Hospital Stay: Payer: Self-pay

## 2018-06-28 ENCOUNTER — Encounter: Payer: Self-pay | Admitting: Gastroenterology

## 2018-06-28 LAB — METHYLMALONIC ACID, SERUM: Methylmalonic Acid, Quantitative: 82 nmol/L (ref 0–378)

## 2018-06-29 ENCOUNTER — Ambulatory Visit: Payer: Medicaid Other

## 2018-07-05 ENCOUNTER — Ambulatory Visit: Payer: Medicaid Other

## 2018-07-06 ENCOUNTER — Ambulatory Visit: Payer: Medicaid Other

## 2018-07-12 ENCOUNTER — Ambulatory Visit: Payer: Medicaid Other

## 2018-07-13 ENCOUNTER — Ambulatory Visit: Payer: Medicaid Other

## 2018-07-19 ENCOUNTER — Ambulatory Visit: Payer: Medicaid Other

## 2018-07-20 ENCOUNTER — Ambulatory Visit: Payer: Medicaid Other

## 2018-07-21 ENCOUNTER — Telehealth: Payer: Self-pay | Admitting: *Deleted

## 2018-07-21 NOTE — Telephone Encounter (Signed)
Called patient and LVM that we are following up with her regarding her plans to have repeat labs and possible Venofer infusion since her surgery.  Both her HGB and ferritin were low when we saw her in July.  Asked her to call back to let us know her plans.

## 2018-07-21 NOTE — Telephone Encounter (Signed)
-----   Message from Karen Kitchens, NP sent at 07/20/2018  6:23 PM EDT ----- Regarding: FW: Follow-up labs and +/- Venofer Never touched base with her. Call her, or send a MyChart message to determine plans.   Gaspar Bidding ----- Message ----- From: Lequita Asal, MD Sent: 07/18/2018   6:55 AM EDT To: Karen Kitchens, NP Subject: Follow-up labs and +/- Venofer                  I know patient was able to have her hemorrhoid surgery early (8/5).  She needs to come back to check her labs and get IV iron.  On 06/25/2018, hemoglobin was 9.2 and ferritin 5 on 06/21/2018.  M

## 2018-07-28 ENCOUNTER — Other Ambulatory Visit: Payer: Self-pay | Admitting: *Deleted

## 2018-07-28 DIAGNOSIS — D509 Iron deficiency anemia, unspecified: Secondary | ICD-10-CM

## 2018-07-29 ENCOUNTER — Inpatient Hospital Stay: Payer: Medicaid Other

## 2018-07-29 ENCOUNTER — Inpatient Hospital Stay: Payer: Medicaid Other | Attending: Hematology and Oncology

## 2018-07-29 VITALS — BP 128/81 | HR 74 | Temp 97.8°F | Resp 16

## 2018-07-29 DIAGNOSIS — D500 Iron deficiency anemia secondary to blood loss (chronic): Secondary | ICD-10-CM

## 2018-07-29 DIAGNOSIS — D509 Iron deficiency anemia, unspecified: Secondary | ICD-10-CM

## 2018-07-29 DIAGNOSIS — D62 Acute posthemorrhagic anemia: Secondary | ICD-10-CM | POA: Diagnosis present

## 2018-07-29 LAB — CBC WITH DIFFERENTIAL/PLATELET
Basophils Absolute: 0 10*3/uL (ref 0–0.1)
Basophils Relative: 1 %
Eosinophils Absolute: 0.2 10*3/uL (ref 0–0.7)
Eosinophils Relative: 4 %
HCT: 27.8 % — ABNORMAL LOW (ref 35.0–47.0)
Hemoglobin: 8.6 g/dL — ABNORMAL LOW (ref 12.0–16.0)
Lymphocytes Relative: 34 %
Lymphs Abs: 1.4 10*3/uL (ref 1.0–3.6)
MCH: 23.6 pg — ABNORMAL LOW (ref 26.0–34.0)
MCHC: 30.8 g/dL — ABNORMAL LOW (ref 32.0–36.0)
MCV: 76.5 fL — ABNORMAL LOW (ref 80.0–100.0)
Monocytes Absolute: 0.4 10*3/uL (ref 0.2–0.9)
Monocytes Relative: 10 %
Neutro Abs: 2.2 10*3/uL (ref 1.4–6.5)
Neutrophils Relative %: 51 %
Platelets: 286 10*3/uL (ref 150–440)
RBC: 3.63 MIL/uL — ABNORMAL LOW (ref 3.80–5.20)
RDW: 17.1 % — ABNORMAL HIGH (ref 11.5–14.5)
WBC: 4.2 10*3/uL (ref 3.6–11.0)

## 2018-07-29 LAB — COMPREHENSIVE METABOLIC PANEL
ALT: 12 U/L (ref 0–44)
AST: 13 U/L — ABNORMAL LOW (ref 15–41)
Albumin: 4.1 g/dL (ref 3.5–5.0)
Alkaline Phosphatase: 37 U/L — ABNORMAL LOW (ref 38–126)
Anion gap: 7 (ref 5–15)
BUN: 12 mg/dL (ref 6–20)
CO2: 25 mmol/L (ref 22–32)
Calcium: 8.7 mg/dL — ABNORMAL LOW (ref 8.9–10.3)
Chloride: 110 mmol/L (ref 98–111)
Creatinine, Ser: 0.61 mg/dL (ref 0.44–1.00)
GFR calc Af Amer: >60 mL/min (ref >60)
GFR calc non Af Amer: >60 mL/min (ref >60)
Glucose, Bld: 74 mg/dL (ref 70–99)
Potassium: 4.3 mmol/L (ref 3.5–5.1)
Sodium: 142 mmol/L (ref 135–145)
Total Bilirubin: 0.5 mg/dL (ref 0.3–1.2)
Total Protein: 7.3 g/dL (ref 6.5–8.1)

## 2018-07-29 LAB — TSH: TSH: 1.049 u[IU]/mL (ref 0.350–4.500)

## 2018-07-29 LAB — RETICULOCYTES
RBC.: 3.63 MIL/uL — ABNORMAL LOW (ref 3.80–5.20)
Retic Count, Absolute: 54.5 10*3/uL (ref 19.0–183.0)
Retic Ct Pct: 1.5 % (ref 0.4–3.1)

## 2018-07-29 LAB — IRON AND TIBC
Iron: 15 ug/dL — ABNORMAL LOW (ref 28–170)
Saturation Ratios: 3 % — ABNORMAL LOW (ref 10.4–31.8)
TIBC: 444 ug/dL (ref 250–450)
UIBC: 429 ug/dL

## 2018-07-29 LAB — FOLATE: Folate: 26 ng/mL (ref >5.9)

## 2018-07-29 LAB — FERRITIN: Ferritin: 3 ng/mL — ABNORMAL LOW (ref 11–307)

## 2018-07-29 LAB — VITAMIN B12: Vitamin B-12: 392 pg/mL (ref 180–914)

## 2018-07-29 MED ORDER — IRON SUCROSE 20 MG/ML IV SOLN
200.0000 mg | Freq: Once | INTRAVENOUS | Status: AC
  Administered 2018-07-29: 200 mg via INTRAVENOUS
  Filled 2018-07-29 (×3): qty 10

## 2018-07-29 MED ORDER — IRON SUCROSE 20 MG/ML IV SOLN: 200.0000 mg | Freq: Once | INTRAVENOUS | Status: DC

## 2018-07-29 MED ORDER — SODIUM CHLORIDE 0.9 % IV SOLN
Freq: Once | INTRAVENOUS | Status: AC
  Administered 2018-07-29: 10:00:00 via INTRAVENOUS
  Filled 2018-07-29: qty 250

## 2018-07-29 NOTE — Patient Instructions (Signed)
Iron Sucrose injection What is this medicine? IRON SUCROSE (AHY ern SOO krohs) is an iron complex. Iron is used to make healthy red blood cells, which carry oxygen and nutrients throughout the body. This medicine is used to treat iron deficiency anemia in people with chronic kidney disease. This medicine may be used for other purposes; ask your health care provider or pharmacist if you have questions. COMMON BRAND NAME(S): Venofer What should I tell my health care provider before I take this medicine? They need to know if you have any of these conditions: -anemia not caused by low iron levels -heart disease -high levels of iron in the blood -kidney disease -liver disease -an unusual or allergic reaction to iron, other medicines, foods, dyes, or preservatives -pregnant or trying to get pregnant -breast-feeding How should I use this medicine? This medicine is for infusion into a vein. It is given by a health care professional in a hospital or clinic setting. Talk to your pediatrician regarding the use of this medicine in children. While this drug may be prescribed for children as young as 2 years for selected conditions, precautions do apply. Overdosage: If you think you have taken too much of this medicine contact a poison control center or emergency room at once. NOTE: This medicine is only for you. Do not share this medicine with others. What if I miss a dose? It is important not to miss your dose. Call your doctor or health care professional if you are unable to keep an appointment. What may interact with this medicine? Do not take this medicine with any of the following medications: -deferoxamine -dimercaprol -other iron products This medicine may also interact with the following medications: -chloramphenicol -deferasirox This list may not describe all possible interactions. Give your health care provider a list of all the medicines, herbs, non-prescription drugs, or dietary  supplements you use. Also tell them if you smoke, drink alcohol, or use illegal drugs. Some items may interact with your medicine. What should I watch for while using this medicine? Visit your doctor or healthcare professional regularly. Tell your doctor or healthcare professional if your symptoms do not start to get better or if they get worse. You may need blood work done while you are taking this medicine. You may need to follow a special diet. Talk to your doctor. Foods that contain iron include: whole grains/cereals, dried fruits, beans, or peas, leafy green vegetables, and organ meats (liver, kidney). What side effects may I notice from receiving this medicine? Side effects that you should report to your doctor or health care professional as soon as possible: -allergic reactions like skin rash, itching or hives, swelling of the face, lips, or tongue -breathing problems -changes in blood pressure -cough -fast, irregular heartbeat -feeling faint or lightheaded, falls -fever or chills -flushing, sweating, or hot feelings -joint or muscle aches/pains -seizures -swelling of the ankles or feet -unusually weak or tired Side effects that usually do not require medical attention (report to your doctor or health care professional if they continue or are bothersome): -diarrhea -feeling achy -headache -irritation at site where injected -nausea, vomiting -stomach upset -tiredness This list may not describe all possible side effects. Call your doctor for medical advice about side effects. You may report side effects to FDA at 1-800-FDA-1088. Where should I keep my medicine? This drug is given in a hospital or clinic and will not be stored at home. NOTE: This sheet is a summary. It may not cover all possible information. If   you have questions about this medicine, talk to your doctor, pharmacist, or health care provider.  2018 Elsevier/Gold Standard (2011-08-21 17:14:35)  

## 2018-08-05 ENCOUNTER — Inpatient Hospital Stay: Payer: Medicaid Other

## 2018-08-05 ENCOUNTER — Encounter: Payer: Self-pay | Admitting: Pharmacy Technician

## 2018-08-05 VITALS — BP 102/67 | HR 65 | Temp 97.0°F | Resp 18

## 2018-08-05 DIAGNOSIS — D509 Iron deficiency anemia, unspecified: Secondary | ICD-10-CM

## 2018-08-05 DIAGNOSIS — D62 Acute posthemorrhagic anemia: Secondary | ICD-10-CM | POA: Diagnosis not present

## 2018-08-05 LAB — CBC WITH DIFFERENTIAL/PLATELET
Basophils Absolute: 0 10*3/uL (ref 0–0.1)
Basophils Relative: 1 %
Eosinophils Absolute: 0.1 10*3/uL (ref 0–0.7)
Eosinophils Relative: 3 %
HCT: 31.9 % — ABNORMAL LOW (ref 35.0–47.0)
Hemoglobin: 9.9 g/dL — ABNORMAL LOW (ref 12.0–16.0)
Lymphocytes Relative: 27 %
Lymphs Abs: 1.5 10*3/uL (ref 1.0–3.6)
MCH: 23.5 pg — ABNORMAL LOW (ref 26.0–34.0)
MCHC: 30.9 g/dL — ABNORMAL LOW (ref 32.0–36.0)
MCV: 76.1 fL — ABNORMAL LOW (ref 80.0–100.0)
Monocytes Absolute: 0.5 10*3/uL (ref 0.2–0.9)
Monocytes Relative: 9 %
Neutro Abs: 3.2 10*3/uL (ref 1.4–6.5)
Neutrophils Relative %: 60 %
Platelets: 354 10*3/uL (ref 150–440)
RBC: 4.19 MIL/uL (ref 3.80–5.20)
RDW: 18.2 % — ABNORMAL HIGH (ref 11.5–14.5)
WBC: 5.3 10*3/uL (ref 3.6–11.0)

## 2018-08-05 LAB — FERRITIN: Ferritin: 20 ng/mL (ref 11–307)

## 2018-08-05 MED ORDER — SODIUM CHLORIDE 0.9 % IV SOLN
Freq: Once | INTRAVENOUS | Status: AC
  Administered 2018-08-05: 10:00:00 via INTRAVENOUS
  Filled 2018-08-05: qty 250

## 2018-08-05 MED ORDER — IRON SUCROSE 20 MG/ML IV SOLN
200.0000 mg | Freq: Once | INTRAVENOUS | Status: AC
  Administered 2018-08-05: 200 mg via INTRAVENOUS
  Filled 2018-08-05: qty 10

## 2018-08-05 MED ORDER — SODIUM CHLORIDE 0.9 % IV SOLN: 200.0000 mg | Freq: Once | INTRAVENOUS | Status: DC

## 2018-08-05 NOTE — Progress Notes (Unsigned)
Patient has been approved for drug assistance by [employer] for Venofer. The enrollment period is from 07/29/18-08/05/19 based on self pay. First DOS covered is 07/29/18.

## 2018-08-12 ENCOUNTER — Inpatient Hospital Stay: Payer: Medicaid Other

## 2018-08-12 VITALS — BP 103/65 | HR 70 | Temp 95.1°F | Resp 18

## 2018-08-12 DIAGNOSIS — D62 Acute posthemorrhagic anemia: Secondary | ICD-10-CM | POA: Diagnosis not present

## 2018-08-12 DIAGNOSIS — D509 Iron deficiency anemia, unspecified: Secondary | ICD-10-CM

## 2018-08-12 LAB — CBC WITH DIFFERENTIAL/PLATELET
Basophils Absolute: 0 10*3/uL (ref 0–0.1)
Basophils Relative: 1 %
Eosinophils Absolute: 0.1 10*3/uL (ref 0–0.7)
Eosinophils Relative: 3 %
HCT: 35.1 % (ref 35.0–47.0)
Hemoglobin: 10.9 g/dL — ABNORMAL LOW (ref 12.0–16.0)
Lymphocytes Relative: 25 %
Lymphs Abs: 1.4 10*3/uL (ref 1.0–3.6)
MCH: 24.3 pg — ABNORMAL LOW (ref 26.0–34.0)
MCHC: 31.1 g/dL — ABNORMAL LOW (ref 32.0–36.0)
MCV: 78.3 fL — ABNORMAL LOW (ref 80.0–100.0)
Monocytes Absolute: 0.4 10*3/uL (ref 0.2–0.9)
Monocytes Relative: 8 %
Neutro Abs: 3.7 10*3/uL (ref 1.4–6.5)
Neutrophils Relative %: 63 %
Platelets: 319 10*3/uL (ref 150–440)
RBC: 4.48 MIL/uL (ref 3.80–5.20)
RDW: 20 % — ABNORMAL HIGH (ref 11.5–14.5)
WBC: 5.7 10*3/uL (ref 3.6–11.0)

## 2018-08-12 LAB — FERRITIN: Ferritin: 48 ng/mL (ref 11–307)

## 2018-08-12 MED ORDER — SODIUM CHLORIDE 0.9 % IV SOLN: 200.0000 mg | Freq: Once | INTRAVENOUS | Status: DC

## 2018-08-12 MED ORDER — IRON SUCROSE 20 MG/ML IV SOLN
200.0000 mg | Freq: Once | INTRAVENOUS | Status: AC
  Administered 2018-08-12: 200 mg via INTRAVENOUS
  Filled 2018-08-12 (×2): qty 10

## 2018-08-12 MED ORDER — SODIUM CHLORIDE 0.9 % IV SOLN
Freq: Once | INTRAVENOUS | Status: AC
  Administered 2018-08-12: 10:00:00 via INTRAVENOUS
  Filled 2018-08-12: qty 250

## 2018-08-19 ENCOUNTER — Inpatient Hospital Stay: Payer: Medicaid Other

## 2018-08-19 ENCOUNTER — Other Ambulatory Visit: Payer: Self-pay | Admitting: *Deleted

## 2018-08-19 VITALS — BP 100/63 | HR 58 | Temp 97.3°F | Resp 18

## 2018-08-19 DIAGNOSIS — D509 Iron deficiency anemia, unspecified: Secondary | ICD-10-CM

## 2018-08-19 DIAGNOSIS — D62 Acute posthemorrhagic anemia: Secondary | ICD-10-CM | POA: Diagnosis not present

## 2018-08-19 LAB — CBC WITH DIFFERENTIAL/PLATELET
Basophils Absolute: 0.1 10*3/uL (ref 0–0.1)
Basophils Relative: 1 %
Eosinophils Absolute: 0.1 10*3/uL (ref 0–0.7)
Eosinophils Relative: 3 %
HCT: 36.4 % (ref 35.0–47.0)
Hemoglobin: 11.4 g/dL — ABNORMAL LOW (ref 12.0–16.0)
Lymphocytes Relative: 26 %
Lymphs Abs: 1.4 10*3/uL (ref 1.0–3.6)
MCH: 24.9 pg — ABNORMAL LOW (ref 26.0–34.0)
MCHC: 31.4 g/dL — ABNORMAL LOW (ref 32.0–36.0)
MCV: 79.3 fL — ABNORMAL LOW (ref 80.0–100.0)
Monocytes Absolute: 0.4 10*3/uL (ref 0.2–0.9)
Monocytes Relative: 9 %
Neutro Abs: 3.2 10*3/uL (ref 1.4–6.5)
Neutrophils Relative %: 61 %
Platelets: 331 10*3/uL (ref 150–440)
RBC: 4.59 MIL/uL (ref 3.80–5.20)
RDW: 21.6 % — ABNORMAL HIGH (ref 11.5–14.5)
WBC: 5.2 10*3/uL (ref 3.6–11.0)

## 2018-08-19 LAB — FERRITIN: Ferritin: 62 ng/mL (ref 11–307)

## 2018-08-19 MED ORDER — IRON SUCROSE 20 MG/ML IV SOLN
200.0000 mg | Freq: Once | INTRAVENOUS | Status: AC
  Administered 2018-08-19: 200 mg via INTRAVENOUS
  Filled 2018-08-19 (×3): qty 10

## 2018-08-19 MED ORDER — SODIUM CHLORIDE 0.9 % IV SOLN
Freq: Once | INTRAVENOUS | Status: AC
  Administered 2018-08-19: 10:00:00 via INTRAVENOUS
  Filled 2018-08-19: qty 250

## 2018-08-19 MED ORDER — SODIUM CHLORIDE 0.9 % IV SOLN: 200.0000 mg | Freq: Once | INTRAVENOUS | Status: DC

## 2018-09-01 ENCOUNTER — Inpatient Hospital Stay: Payer: Medicaid Other | Attending: Hematology and Oncology

## 2018-09-02 ENCOUNTER — Other Ambulatory Visit: Payer: Medicaid Other

## 2018-09-11 ENCOUNTER — Encounter: Payer: Self-pay | Admitting: Hematology and Oncology

## 2018-11-09 ENCOUNTER — Inpatient Hospital Stay: Payer: Medicaid Other | Attending: Hematology and Oncology

## 2018-11-09 ENCOUNTER — Other Ambulatory Visit: Payer: Medicaid Other

## 2018-11-09 DIAGNOSIS — D509 Iron deficiency anemia, unspecified: Secondary | ICD-10-CM | POA: Diagnosis present

## 2018-11-09 DIAGNOSIS — N920 Excessive and frequent menstruation with regular cycle: Secondary | ICD-10-CM | POA: Insufficient documentation

## 2018-11-09 DIAGNOSIS — D508 Other iron deficiency anemias: Secondary | ICD-10-CM | POA: Insufficient documentation

## 2018-11-09 DIAGNOSIS — K642 Third degree hemorrhoids: Secondary | ICD-10-CM | POA: Insufficient documentation

## 2018-11-09 DIAGNOSIS — Z7289 Other problems related to lifestyle: Secondary | ICD-10-CM | POA: Insufficient documentation

## 2018-11-09 DIAGNOSIS — Z809 Family history of malignant neoplasm, unspecified: Secondary | ICD-10-CM | POA: Insufficient documentation

## 2018-11-09 DIAGNOSIS — Z975 Presence of (intrauterine) contraceptive device: Secondary | ICD-10-CM | POA: Insufficient documentation

## 2018-11-09 LAB — CBC WITH DIFFERENTIAL/PLATELET
Abs Immature Granulocytes: 0.02 10*3/uL (ref 0.00–0.07)
Basophils Absolute: 0 10*3/uL (ref 0.0–0.1)
Basophils Relative: 1 %
Eosinophils Absolute: 0.1 10*3/uL (ref 0.0–0.5)
Eosinophils Relative: 2 %
HCT: 39.3 % (ref 36.0–46.0)
Hemoglobin: 12.6 g/dL (ref 12.0–15.0)
Immature Granulocytes: 0 %
Lymphocytes Relative: 29 %
Lymphs Abs: 1.8 10*3/uL (ref 0.7–4.0)
MCH: 27.2 pg (ref 26.0–34.0)
MCHC: 32.1 g/dL (ref 30.0–36.0)
MCV: 84.9 fL (ref 80.0–100.0)
Monocytes Absolute: 0.5 10*3/uL (ref 0.1–1.0)
Monocytes Relative: 9 %
Neutro Abs: 3.7 10*3/uL (ref 1.7–7.7)
Neutrophils Relative %: 59 %
Platelets: 323 10*3/uL (ref 150–400)
RBC: 4.63 MIL/uL (ref 3.87–5.11)
RDW: 16.3 % — ABNORMAL HIGH (ref 11.5–15.5)
WBC: 6.1 10*3/uL (ref 4.0–10.5)
nRBC: 0 % (ref 0.0–0.2)

## 2018-11-09 LAB — FERRITIN: Ferritin: 13 ng/mL (ref 11–307)

## 2018-11-10 ENCOUNTER — Inpatient Hospital Stay (HOSPITAL_BASED_OUTPATIENT_CLINIC_OR_DEPARTMENT_OTHER): Payer: Medicaid Other | Admitting: Hematology and Oncology

## 2018-11-10 ENCOUNTER — Inpatient Hospital Stay: Payer: Medicaid Other

## 2018-11-10 ENCOUNTER — Encounter: Payer: Self-pay | Admitting: Hematology and Oncology

## 2018-11-10 ENCOUNTER — Encounter: Payer: Self-pay | Admitting: Pharmacy Technician

## 2018-11-10 VITALS — BP 106/70 | HR 57 | Temp 98.7°F | Resp 18 | Wt 150.9 lb

## 2018-11-10 DIAGNOSIS — Z975 Presence of (intrauterine) contraceptive device: Secondary | ICD-10-CM | POA: Diagnosis not present

## 2018-11-10 DIAGNOSIS — Z7289 Other problems related to lifestyle: Secondary | ICD-10-CM

## 2018-11-10 DIAGNOSIS — K642 Third degree hemorrhoids: Secondary | ICD-10-CM

## 2018-11-10 DIAGNOSIS — N920 Excessive and frequent menstruation with regular cycle: Secondary | ICD-10-CM

## 2018-11-10 DIAGNOSIS — D508 Other iron deficiency anemias: Secondary | ICD-10-CM

## 2018-11-10 DIAGNOSIS — D509 Iron deficiency anemia, unspecified: Secondary | ICD-10-CM | POA: Diagnosis not present

## 2018-11-10 DIAGNOSIS — Z809 Family history of malignant neoplasm, unspecified: Secondary | ICD-10-CM

## 2018-11-10 NOTE — Progress Notes (Signed)
Pt. Here for follow up. Denies any complaints at this time.  

## 2018-11-10 NOTE — Progress Notes (Signed)
[place] [facility] day:  11/10/2018  Chief Complaint: Sherri Fitzgerald is a 32 y.o. female with iron deficiency anemia who is seen for 3 month assessment after initiation of IV iron.  HPI:  The patient was last seen in the hematology clinic on 06/24/2018.  At that time, she was seen for initial consultation.  She had iron deficiency anemia secondary to multiple pregnancies and symptomatic bleeding hemorrhoids.  She was markedly fatigued. She experienced heavy hemorrhoidal bleeding with bowel movements. Her hemorrhoids "spray" with increase rectal pressure during bowel movements.  She underwent flexible sigmoidoscopy by Dr. Marius Ditch on 06/25/2018.  Findings revealed hemorrhoids on perianal exam.  There were non-bleeding external and internal hemorrhoids during retroflexion. The hemorrhoids were large and Grade III (internal hemorrhoids that prolapse but require manual reduction) with stigmata of recent bleeding.  Normal mucosa was found in the rectum, in the recto-sigmoid colon, in the sigmoid colon and in the descending colon.  There were areas of post banding scar were found in the rectum. The scar tissue was healthy in appearance.  She underwent hemorrhoidectomy on 06/28/2018 at [facility] by Dr. Harlon Ditty. Patient notes that the recovery was "horrible".   Labs on 07/29/2018 revealed a hematocrit of 27.8, hemoglobin 8.6, MCV 76.5, platelets 286,0000, and WBC 4200.  Retic was 1.5%.  CMP was normal.  Ferritin was 3 with an iron saturation of 3%.  Folate was 26.  B12 was 392.  TSH was 1.049.  She received Venofer weekly x 4 (07/29/2018 - 08/19/2018).  CBC on 11/09/2018 revealed a hematocrit of 39.3, hemoglobin 12.6, MCV 84.9, platelets 323,000, and WBC 6100.  Ferritin was 13.  During the interim, patient is doing well since her surgery. She has fully recovered. Patient denies any recurrent gastrointestinal bleeding. She denies chest pain and shortness of breath. She  experiences heavy menstrual bleeding that lasts between 7 and 8 days. Patient has not been worked up for menorrhagia by her GYN provider. Patient has copper IUD in place, which she attributes her heavy menstrual bleeding. Patient denies heavy menstrual cycles prior to having the copper IUD in place. Patient verbalizes concerns related to her contraceptive measures, citing known hormonal issues and a failed condom that resulted in an unplanned pregnancy.   She denies ASA or NSAID products. She is eating more iron rich foods. Her family is making a conscious effort to eat more red meat and green leafy vegetables. Patient has been intermittently taking oral iron since her surgery. She denies associated constipation. Patient notes that she is only taking oral iron tablets about 4 times per week. It ultimately comes down to patient forgetting to take her supplement. Patient advises that she maintains an adequate appetite. She is eating well. Weight today is 150 lb 14.5 oz (68.5 kg), which compared to herlast visit to the clinic, represents a 10 pound increase.     Patient denies pain in the clinic today.   History reviewed. No pertinent past medical history.    Past Surgical History:  Procedure Laterality Date  . CESAREAN SECTION  2006  . FLEXIBLE SIGMOIDOSCOPY N/A 06/25/2018   Procedure: FLEXIBLE SIGMOIDOSCOPY;  Surgeon: Lin Landsman, MD;  Location: [facility]COPY;  Service: Gastroenterology;  Laterality: N/A;    Family History  Problem Relation Age of Onset  . Cancer Paternal Grandmother     Social History:  reports that she has never smoked. She has never used smokeless tobacco. She reports current alcohol use. She reports that  she does not use drugs.  She has 6 children (ages 59, 7, 65, 29, 14, and 1).  She lives in [place], [place].  The patient is alone today.  Allergies: No Known Allergies  Current Medications: Current Outpatient Medications  Medication Sig Dispense Refill  . ferrous  sulfate 325 (65 FE) MG EC tablet Take 325 mg by mouth daily with breakfast.    . PARAGARD INTRAUTERINE COPPER IUD IUD ParaGard T 380A 380 square mm intrauterine device  Take 1 device by intrauterine route.    . Pyridoxine HCl (VITAMIN B-6) 500 MG tablet Take 500 mg by mouth daily.    . Multiple Vitamins-Minerals (MULTIVITAMIN ADULT PO) multivitamin     No current facility-administered medications for this visit.     Review of Systems  Constitutional: Negative for chills, diaphoresis, fever, malaise/fatigue and weight loss (up 10 pounds).       Feeling better.  HENT: Negative.  Negative for congestion, ear discharge, ear pain, nosebleeds, sinus pain, sore throat and tinnitus.   Eyes: Negative.  Negative for blurred vision, double vision, photophobia and pain.  Respiratory: Negative.  Negative for cough, hemoptysis, sputum production and shortness of breath.   Cardiovascular: Negative for chest pain, palpitations, orthopnea, leg swelling and PND.  Gastrointestinal: Negative.  Negative for abdominal pain, blood in stool (resolved), constipation, diarrhea, melena, nausea and vomiting.  Genitourinary: Negative for dysuria, frequency, hematuria and urgency.       Heavy menses.  Musculoskeletal: Negative.  Negative for back pain, falls, joint pain, myalgias and neck pain.  Skin: Negative.  Negative for itching and rash.  Neurological: Negative.  Negative for dizziness, tingling, tremors, sensory change, speech change, focal weakness, weakness and headaches.  Endo/Heme/Allergies: Negative.  Does not bruise/bleed easily.  Psychiatric/Behavioral: Negative.  Negative for depression and memory loss. The patient is not nervous/anxious.   All other systems reviewed and are negative.  Performance status (ECOG): 0  Vital Signs BP 106/70 (BP Location: Left Arm, Patient Position: Sitting)   Pulse (!) 57   Temp 98.7 F (37.1 C) (Oral)   Resp 18   Wt 150 lb 14.5 oz (68.5 kg)   SpO2 100%   BMI 24.36  kg/m   Physical Exam  Constitutional: She is oriented to person, place, and time and well-developed, well-nourished, and in no distress. No distress.  HENT:  Head: Normocephalic and atraumatic.  Mouth/Throat: Oropharynx is clear and moist. No oropharyngeal exudate.  Blonde hair pulled back.  Eyes: Pupils are equal, round, and reactive to light. Conjunctivae and EOM are normal. No scleral icterus.  Blue eyes.  Neck: Normal range of motion. Neck supple. No JVD present.  Cardiovascular: Normal rate, regular rhythm, normal heart sounds and intact distal pulses. Exam reveals no gallop and no friction rub.  No murmur heard. Pulmonary/Chest: Effort normal and breath sounds normal. No respiratory distress. She has no wheezes. She has no rales.  Abdominal: Soft. Bowel sounds are normal. She exhibits no distension and no mass. There is no abdominal tenderness. There is no rebound and no guarding.  Musculoskeletal: Normal range of motion.        General: No tenderness.  Lymphadenopathy:    She has no cervical adenopathy.    She has no axillary adenopathy.       Right: No inguinal and no supraclavicular adenopathy present.       Left: No inguinal and no supraclavicular adenopathy present.  Neurological: She is alert and oriented to person, place, and time. Gait normal.  Skin: Skin is warm and dry. No rash noted. She is not diaphoretic. No erythema.  Psychiatric: Mood, affect and judgment normal.  Vitals reviewed.   Appointment on 11/09/2018  Component Date Value Ref Range Status  . Ferritin 11/09/2018 13  11 - 307 ng/mL Final   Performed at [facility], [address] [place], [place] [address]  . WBC 11/09/2018 6.1  4.0 - 10.5 K/uL Final  . RBC 11/09/2018 4.63  3.87 - 5.11 MIL/uL Final  . Hemoglobin 11/09/2018 12.6  12.0 - 15.0 g/dL Final  . HCT 11/09/2018 39.3  36.0 - 46.0 % Final  . MCV 11/09/2018 84.9  80.0 - 100.0 fL Final  . MCH 11/09/2018 27.2  26.0 - 34.0 pg Final  .  MCHC 11/09/2018 32.1  30.0 - 36.0 g/dL Final  . RDW 11/09/2018 16.3* 11.5 - 15.5 % Final  . Platelets 11/09/2018 323  150 - 400 K/uL Final  . nRBC 11/09/2018 0.0  0.0 - 0.2 % Final  . Neutrophils Relative % 11/09/2018 59  % Final  . Neutro Abs 11/09/2018 3.7  1.7 - 7.7 K/uL Final  . Lymphocytes Relative 11/09/2018 29  % Final  . Lymphs Abs 11/09/2018 1.8  0.7 - 4.0 K/uL Final  . Monocytes Relative 11/09/2018 9  % Final  . Monocytes Absolute 11/09/2018 0.5  0.1 - 1.0 K/uL Final  . Eosinophils Relative 11/09/2018 2  % Final  . Eosinophils Absolute 11/09/2018 0.1  0.0 - 0.5 K/uL Final  . Basophils Relative 11/09/2018 1  % Final  . Basophils Absolute 11/09/2018 0.0  0.0 - 0.1 K/uL Final  . Immature Granulocytes 11/09/2018 0  % Final  . Abs Immature Granulocytes 11/09/2018 0.02  0.00 - 0.07 K/uL Final   Performed at [facility], [address] [place], [place] [address]    Assessment:  Yaqueline Gutter is a 32 y.o. female with iron deficiency anemia secondary to multiple pregnancies and symptomatic bleeding hemorrhoids.  She has had grade 3 hemorrhoids since age of 26.  She is s/p rubber band ligation of the hemorrhoids x 5.  She underwent hemorrhoidectomy on 06/28/2018 at [facility]  She has received IV iron in the past at [facility] - Venofer 500 mg IV with APAP 650 mg, diphenhydramine 25 IV, and famotidine 20 mg IV x 2 doses (04/09/2017 and 04/14/2017).  She received Venofer at [facility]:  weekly x 4 (07/29/2018 - 08/19/2018).  Ferritin has been followed.  6 on 04/09/2017, 142 on 04/23/2017, 5 on 06/21/2018, 3 on 07/29/2018, 20 on 08/05/2018, 48 on 08/12/2018, 62 on 08/19/2018, and 13 on 11/09/2018.  Symptomatically, she is feeling better.  She continues to have heavy menses.  She denies any aspirin or ibuprofen use.  Exam is stable.  Plan: 1. Review labs from yesterday. 2. Iron deficiency   Etiology secondary to hemorrhoidal bleeding.  Patient s/p hemorrhoidectomy.  Patient notes  menorrhagia. Hemoglobin 12.6.  MCV 84.9.  Ferritin 13. Discuss additional IV iron versus oral iron.   Continue oral iron.  Ferritin goal 100. 3. RTC in 6 weeks for labs (CBC with diff, ferritin). 4. RTC in 3 months for MD assessment, labs (CBC with diff, ferritin - day before), and +/- Venofer.    Honor Loh, NP  11/10/2018, 11:25 AM   I saw and evaluated the patient, participating in the key portions of the service and reviewing pertinent diagnostic studies and records.  I reviewed the nurse practitioner's note and agree with the findings and the  plan.  The assessment and plan were discussed with the patient.  A few questions were asked by the patient and answered.   Nolon Stalls, MD 11/10/18, 11:25 AM

## 2018-11-10 NOTE — Progress Notes (Signed)
Patient no longer getting Venofer from [employer] based on Medicaid coverage start 10/24/18. Last DOS covered is 08/19/18.

## 2018-12-22 ENCOUNTER — Other Ambulatory Visit: Payer: Medicaid Other

## 2020-01-10 ENCOUNTER — Ambulatory Visit (INDEPENDENT_AMBULATORY_CARE_PROVIDER_SITE_OTHER): Payer: Medicaid Other | Admitting: Obstetrics and Gynecology

## 2020-01-10 ENCOUNTER — Other Ambulatory Visit: Payer: Self-pay

## 2020-01-10 ENCOUNTER — Encounter: Payer: Self-pay | Admitting: Obstetrics and Gynecology

## 2020-01-10 ENCOUNTER — Other Ambulatory Visit (HOSPITAL_COMMUNITY)
Admission: RE | Admit: 2020-01-10 | Discharge: 2020-01-10 | Disposition: A | Discharge status: Home / Self Care | Payer: Medicaid Other | Source: Ambulatory Visit | Attending: Obstetrics and Gynecology | Admitting: Obstetrics and Gynecology

## 2020-01-10 VITALS — BP 118/62 | Ht 66.0 in | Wt 156.0 lb

## 2020-01-10 DIAGNOSIS — I499 Cardiac arrhythmia, unspecified: Secondary | ICD-10-CM | POA: Diagnosis not present

## 2020-01-10 DIAGNOSIS — D500 Iron deficiency anemia secondary to blood loss (chronic): Secondary | ICD-10-CM | POA: Diagnosis not present

## 2020-01-10 DIAGNOSIS — Z124 Encounter for screening for malignant neoplasm of cervix: Secondary | ICD-10-CM | POA: Diagnosis present

## 2020-01-10 DIAGNOSIS — Z1329 Encounter for screening for other suspected endocrine disorder: Secondary | ICD-10-CM

## 2020-01-10 DIAGNOSIS — N921 Excessive and frequent menstruation with irregular cycle: Secondary | ICD-10-CM | POA: Diagnosis not present

## 2020-01-10 DIAGNOSIS — Z975 Presence of (intrauterine) contraceptive device: Secondary | ICD-10-CM

## 2020-01-10 NOTE — Progress Notes (Signed)
Patient ID: Sherri Fitzgerald, female   DOB: 01/12/1986, 34 y.o.   MRN: [id number]  Reason for Consult: Dysmenorrhea (Severe bleeding with periods, no cramping will last around 8-11 days)   Referred by No ref. provider found  Subjective:     HPI:  Sherri Fitzgerald is a 34 y.o. female she presents today for a discussion of her menorrhagia.  She reports that she has a copper IUD which has been in place 2018 she had heavy bleeding even before that a copper IUD.  Gynecological History Menarche: 11 LMP: 12/27/2018 Describes periods as regular monthly.  Up until 2016.  She reports that since 2016 she has been having irregular menstrual bleeding.  She reports that her periods usually last 9 to 11 days a month.  She reports that she was then have spotting in between her menstrual cycle.  The bleeding is generally heavy.  She requires her to wear those a menstrual cup in the past.  Discussed menstrual cup she has gushing of blood constant stream of blood.  She has had many accidents in room and that he cannot pass.  At night she sleeps with pads and has to wake up in the middle the night to change her pad and empty her menstrual cup.  She reports that when she is awake she sees a menstrual cup for 12-50 of blood every 2 hours.  And 2019 that she had IV iron transfusions after a hemorrhoidectomy.  The hemorrhoidectomy was performed urgently because of heavy bleeding at the time of the hemorrhoidectomy.  She had a colonoscopy afterwards which was normal.  She has not had a Pap smear in several years.  She has a history of ovarian cyst and rupture of the ovarian cyst.  She has no history of fibroids or polyps or endometriosis.  She denies any family history of breast uterine or ovarian cancer she has a paternal grandmother died of colon cancer in her 8s.   Last pap smear: Unknown History of STDs: No Sexually Active: Yes no pain with intercourse.   Obstetrical History [id number] Hx of 1 cesarean section and 5  vaginal births.  Past Medical History:  Diagnosis Date  . Anemia    Family History  Problem Relation Age of Onset  . Cancer Paternal Grandmother    Past Surgical History:  Procedure Laterality Date  . CESAREAN SECTION  2006  . FLEXIBLE SIGMOIDOSCOPY N/A 06/25/2018   Procedure: FLEXIBLE SIGMOIDOSCOPY;  Surgeon: Lin Landsman, MD;  Location: [facility] ENDOSCOPY;  Service: Gastroenterology;  Laterality: N/A;  . HEMORROIDECTOMY  06/2018    Short Social History:  Social History   Tobacco Use  . Smoking status: Never Smoker  . Smokeless tobacco: Never Used  Substance Use Topics  . Alcohol use: Yes    Comment: ocassionally    No Known Allergies  Current Outpatient Medications  Medication Sig Dispense Refill  . ferrous sulfate 325 (65 FE) MG EC tablet Take 325 mg by mouth daily with breakfast.    . Multiple Vitamins-Minerals (MULTIVITAMIN ADULT PO) multivitamin    . PARAGARD INTRAUTERINE COPPER IUD IUD ParaGard T 380A 380 square mm intrauterine device  Take 1 device by intrauterine route.    . Pyridoxine HCl (VITAMIN B-6) 500 MG tablet Take 500 mg by mouth daily.     No current facility-administered medications for this visit.    Review of Systems  Constitutional: Negative for chills, fatigue, fever and unexpected weight change.  HENT: Negative for trouble swallowing.  Eyes: Negative  for loss of vision.  Respiratory: Negative for cough, shortness of breath and wheezing.  Cardiovascular: Negative for chest pain, leg swelling, palpitations and syncope.  GI: Negative for abdominal pain, blood in stool, diarrhea, nausea and vomiting.  GU: Negative for difficulty urinating, dysuria, frequency and hematuria.  Musculoskeletal: Negative for back pain, leg pain and joint pain.  Skin: Negative for rash.  Neurological: Negative for dizziness, headaches, light-headedness, numbness and seizures.  Psychiatric: Negative for behavioral problem, confusion, depressed mood and sleep  disturbance.        Objective:  Objective   Vitals:   01/10/20 1323  Weight: 156 lb (70.8 kg)  Height: 5\' 6"  (1.676 m)   Body mass index is 25.18 kg/m.  Physical Exam Vitals and nursing note reviewed.  Constitutional:      Appearance: She is well-developed.  HENT:     Head: Normocephalic and atraumatic.  Eyes:     Pupils: Pupils are equal, round, and reactive to light.  Cardiovascular:     Rate and Rhythm: Normal rate and regular rhythm.  Pulmonary:     Effort: Pulmonary effort is normal. No respiratory distress.  Genitourinary:    Comments: External: Normal appearing vulva. No lesions noted.  Speculum examination: Normal appearing cervix. No blood in the vaginal vault. no discharge.  IUD strings seen. Bimanual examination: Uterus midline, non-tender, normal in size, shape and contour.  No CMT. No adnexal masses. No adnexal tenderness. Pelvis not fixed.  Skin:    General: Skin is warm and dry.  Neurological:     Mental Status: She is alert and oriented to person, place, and time.  Psychiatric:        Behavior: Behavior normal.        Thought Content: Thought content normal.        Judgment: Judgment normal.        Assessment/Plan:     34 yo with menorrhagia Discussion with patient regarding her options for management of menorrhagia including medical and surgical management.  She understands that a copper IUD can cause heavier menstrual bleeding however she had the menorrhagia even prior to placement of the IUD.  She does not desire any further pregnancies and feels that her family is complete.  She because of religious reasons she is not interested in other options for medical management of menorrhagia with hormonal birth control.  She is most interested at this time and having a hysterectomy.   We will have her return for a pelvic ultrasound labs today including Pap smear.  No recent CBC or iron studies we will update database today as well.    She has a family  history of hypertrophic cardiomyopathy and wishes to avoid medications such as Lysteda which could increase her risk for clots.  She was tested for this disease as a child because of her family predisposition.  She has developed some symptoms of irregular heartbeat.  Since she is most interested in surgery will refer her to cardiology for precardiac clearance.   More than 25 minutes were spent face to face with the patient in the room with more than 50% of the time spent providing counseling and discussing the plan of management.       Adrian Prows MD [facility] 01/10/2020 1:41 PM

## 2020-01-10 NOTE — Patient Instructions (Signed)
Hysterectomy Information  A hysterectomy is a surgery in which the uterus is removed. The fallopian tubes and ovaries may be removed (bilateral salpingo-oophorectomy) as well. This procedure may be done to treat various medical problems. After the procedure, a woman will no longer have menstrual periods nor will she be able to become pregnant (sterile). What are the reasons for a hysterectomy? There are many reasons why a woman might have this procedure. They include:  Persistent, abnormal vaginal bleeding.  Long-term (chronic) pelvic pain or infection.  Endometriosis. This is when the lining of the uterus (endometrium) starts to grow outside the uterus.  Adenomyosis. This is when the endometrium starts to grow in the muscle of the uterus.  Pelvic organ prolapse. This is a condition in which the uterus falls down into the vagina.  Noncancerous growths in the uterus (uterine fibroids) that cause symptoms.  The presence of precancerous cells.  Cervical or uterine cancer. What are the different types of hysterectomy? There are three different types of hysterectomy:  Supracervical hysterectomy. In this type, the top part of the uterus is removed, but not the cervix.  Total hysterectomy. In this type, the uterus and cervix are removed.  Radical hysterectomy. In this type, the uterus, the cervix, and the tissue that holds the uterus in place (parametrium) are removed. What are the different ways a hysterectomy can be performed? There are many different ways a hysterectomy can be performed, including:  Abdominal hysterectomy. In this type, an incision is made in the abdomen. The uterus is removed through this incision.  Vaginal hysterectomy. In this type, an incision is made in the vagina. The uterus is removed through this incision. There are no abdominal incisions.  Conventional laparoscopic hysterectomy. In this type, three or four small incisions are made in the abdomen. A thin,  lighted tube with a camera (laparoscope) is inserted into one of the incisions. Other tools are put through the other incisions. The uterus is cut into small pieces. The small pieces are removed through the incisions or through the vagina.  Laparoscopically assisted vaginal hysterectomy (LAVH). In this type, three or four small incisions are made in the abdomen. Part of the surgery is performed laparoscopically and the other part is done vaginally. The uterus is removed through the vagina.  Robot-assisted laparoscopic hysterectomy. In this type, a laparoscope and other tools are inserted into three or four small incisions in the abdomen. A computer-controlled device is used to give the surgeon a 3D image and to help control the surgical instruments. This allows for more precise movements of surgical instruments. The uterus is cut into small pieces and removed through the incisions or removed through the vagina. Discuss the options with your health care provider to determine which type is the right one for you. What are the risks? Generally, this is a safe procedure. However, problems may occur, including:  Bleeding and risk of blood transfusion. Tell your health care provider if you do not want to receive any blood products.  Blood clots in the legs or lung.  Infection.  Damage to other structures or organs.  Allergic reactions to medicines.  Changing to an abdominal hysterectomy from one of the other techniques. What to expect after a hysterectomy  You will be given pain medicine.  You may need to stay in the hospital for 1- 2 days to recover, depending on the type of hysterectomy you had.  Follow your health care provider's instructions about exercise, driving, and general activities. Ask your   health care provider what activities are safe for you.  You will need to have someone with you for the first 3-5 days after you go home.  You will need to follow up with your surgeon in 2-4  weeks after surgery to evaluate your progress.  If the ovaries are removed, you will have early menopause symptoms such as hot flashes, night sweats, and insomnia.  If you had a hysterectomy for a problem that was not cancer or not a condition that could lead to cancer, then you no longer need Pap tests. However, even if you no longer need a Pap test, a regular pelvic exam is a good idea to make sure no other problems are developing. Questions to ask your health care provider  Is a hysterectomy medically necessary? Do I have other treatment options for my condition?  What are my options for hysterectomy procedure?  What organs and tissues need to be removed?  What are the risks?  What are the benefits?  How long will I need to stay in the hospital after the procedure?  How long will I need to recover at home?  What symptoms can I expect after the procedure? Summary  A hysterectomy is a surgery in which the uterus is removed. The fallopian tubes and ovaries may be removed (bilateral salpingo-oophorectomy) as well.  This procedure may be done to treat various medical problems. After the procedure, a woman will no longer have menstrual periods nor will she be able to become pregnant.  Discuss the options with your health care provider to determine which type of hysterectomy is the right one for you. This information is not intended to replace advice given to you by your health care provider. Make sure you discuss any questions you have with your health care provider. Document Revised: 10/23/2017 Document Reviewed: 12/17/2016 Elsevier Patient Education  2020 [employer]  

## 2020-01-11 ENCOUNTER — Telehealth: Payer: Self-pay

## 2020-01-11 LAB — CBC
Hematocrit: 40.3 % (ref 34.0–46.6)
Hemoglobin: 13.3 g/dL (ref 11.1–15.9)
MCH: 28.2 pg (ref 26.6–33.0)
MCHC: 33 g/dL (ref 31.5–35.7)
MCV: 85 fL (ref 79–97)
Platelets: 368 10*3/uL (ref 150–450)
RBC: 4.72 x10E6/uL (ref 3.77–5.28)
RDW: 12.8 % (ref 11.7–15.4)
WBC: 8.7 10*3/uL (ref 3.4–10.8)

## 2020-01-11 LAB — VITAMIN B12: Vitamin B-12: 441 pg/mL (ref 232–1245)

## 2020-01-11 LAB — IRON AND TIBC
Iron Saturation: 11 % — ABNORMAL LOW (ref 15–55)
Iron: 40 ug/dL (ref 27–159)
Total Iron Binding Capacity: 379 ug/dL (ref 250–450)
UIBC: 339 ug/dL (ref 131–425)

## 2020-01-11 LAB — TSH: TSH: 0.83 u[IU]/mL (ref 0.450–4.500)

## 2020-01-11 LAB — T4, FREE: Free T4: 1.02 ng/dL (ref 0.82–1.77)

## 2020-01-11 LAB — FERRITIN: Ferritin: 10 ng/mL — ABNORMAL LOW (ref 15–150)

## 2020-01-11 NOTE — Telephone Encounter (Signed)
Informed patient of Ferritin levels. Patient does wish to schedule an MD appt. With possible Venofer. Message sent to Dan Europe to schedule patient.

## 2020-01-11 NOTE — Telephone Encounter (Signed)
-----   Message from Lequita Asal, MD sent at 01/11/2020  2:19 PM EST ----- Regarding: Please call patient  Patient last seen in 2019.  Ferritin is 10 (low).  Ferritin goal 100.  She previously received IV iron (Venofer).  Ask patient if she would like to be seen for follow-up and possible IV iron.  M  ----- Message ----- From: Lavone Neri Lab Results In Sent: 01/11/2020   5:36 AM EST To: Lequita Asal, MD

## 2020-01-12 ENCOUNTER — Ambulatory Visit: Payer: Medicaid Other | Admitting: Internal Medicine

## 2020-01-13 LAB — CYTOLOGY - PAP
Chlamydia: NEGATIVE
Comment: NEGATIVE
Comment: NEGATIVE
Comment: NORMAL
Diagnosis: UNDETERMINED — AB
High risk HPV: NEGATIVE
Neisseria Gonorrhea: NEGATIVE

## 2020-01-17 ENCOUNTER — Ambulatory Visit: Payer: Medicaid Other | Admitting: Cardiovascular Disease

## 2020-01-17 ENCOUNTER — Other Ambulatory Visit: Payer: Self-pay

## 2020-01-17 ENCOUNTER — Ambulatory Visit (INDEPENDENT_AMBULATORY_CARE_PROVIDER_SITE_OTHER): Payer: Medicaid Other

## 2020-01-17 ENCOUNTER — Encounter: Payer: Self-pay | Admitting: Cardiovascular Disease

## 2020-01-17 VITALS — BP 120/72 | HR 77 | Ht 66.0 in | Wt 157.0 lb

## 2020-01-17 DIAGNOSIS — R011 Cardiac murmur, unspecified: Secondary | ICD-10-CM

## 2020-01-17 DIAGNOSIS — R002 Palpitations: Secondary | ICD-10-CM

## 2020-01-17 NOTE — Progress Notes (Signed)
[facility]  [address], [address] [place], [place] [address] Phone: [phone]  Fax: [phone]   Clinic Day:  01/20/2020  Referring physician: No ref. provider found  Chief Complaint: Sherri Fitzgerald is a 34 y.o. female with iron deficiency anemia who is seen for a 1 year assessment.  HPI: The patient was last seen in the hematology clinic on 11/10/2018. At that time, she was feeling better. She had heavy menses. She denied any aspirin or ibuprofen use. Exam was stable. Hematocrit 39.3, hemoglobin 12.6, MCV 84.9, platelets 323,000, WBC 6,100. Ferritin was 13. She continued oral iron.   Patient was seen by Dr. Adrian Prows on 01/10/2020. She discussed options for management of menorrhagia including medical and surgical management. Because of religious reasons she was not interested in other options for medical management of menorrhagia with hormonal birth control. She was most interested in a hysterectomy. She had a family history of hypertrophic cardiomyopathy and wished to avoid medications such as Lysteda which could increase her risk for clots.  She was referred her to cardiology for cardiac clearance.   Labs on 01/20/2020 revealed a hematocrit of 40.3, hemoglobin 13.3, MCV 85, platelets 368,000, WBC 8700.  Ferritin was 10 with an iron saturation of 11% and a TIBC 379.  She had an initial visit with Dr. Fletcher Anon on 01/17/2020. She reported almost daily palpitations. She had 2 episodes of brief tachycardia over the last 8 months.  She denied any dizziness, syncope or presyncope.  She did have occasional short-lived chest discomfort but no significant shortness of breath. A  2 week outpatient monitor and echo were ordered.  During the interim, she has felt "good". She currently feels like all her symptoms associated with iron deficiency are back. She continues to heavy menses. She using a cup for her menstrual cycle; menses last 10 days.  She does not leave  home while she is on her cycle because of heavy flow.  She has an IUD in place. She has being seen by her OB/GYN and is considering a vaginal hysterectomy (summer 2021).  She feels like oral iron is not working like it is supposed to. She takes oral iron 1-2 times a day. The current oral iron preparation does not mess with her stomach. She denies any bloody stool or urine. Her stool is "very" dark secondary to the iron.   She cooks with a cast iron exclusively. She has a good diet. She is eating iron rich foods. She likes to cook at home and eats less fast foods.  She has ice pica and craves salt.   She broke her teeth from grinding them. She grinds her teeth at night with associated jaw pain. She temporarily used a mouth guard. She has issues with restless legs. She denies any shortness of breath or chest pain. She notes heart palpitations and is wearing a heart monitor today.   She is interested in starting IV iron today.    Past Medical History:  Diagnosis Date  . Anemia     Past Surgical History:  Procedure Laterality Date  . CESAREAN SECTION  2006  . FLEXIBLE SIGMOIDOSCOPY N/A 06/25/2018   Procedure: FLEXIBLE SIGMOIDOSCOPY;  Surgeon: Lin Landsman, MD;  Location: [facility] ENDOSCOPY;  Service: Gastroenterology;  Laterality: N/A;  . HEMORROIDECTOMY  06/2018    Family History  Problem Relation Age of Onset  . Cancer Paternal Grandmother   . Heart disease Mother        stent   .  Heart attack Mother   . Hypertrophic cardiomyopathy Mother   . Hypertension Mother   . Heart failure Paternal Grandfather   . Heart disease Paternal Grandfather   . Heart attack Paternal Grandfather     Social History:  reports that she has never smoked. She has never used smokeless tobacco. She reports current alcohol use. She reports that she does not use drugs. She has 6 children (ages 28, 71, 74, 68, 9, and 2).  She lives in [place], [place]. The patient is alone today.  Allergies: No Known  Allergies  Current Medications: Current Outpatient Medications  Medication Sig Dispense Refill  . ferrous sulfate 325 (65 FE) MG EC tablet Take 325 mg by mouth daily with breakfast.    . Multiple Vitamins-Minerals (MULTIVITAMIN ADULT PO) multivitamin    . PARAGARD INTRAUTERINE COPPER IUD IUD ParaGard T 380A 380 square mm intrauterine device  Take 1 device by intrauterine route.    . Pyridoxine HCl (VITAMIN B-6) 500 MG tablet Take 500 mg by mouth daily.     No current facility-administered medications for this visit.    Review of Systems  Constitutional: Negative for chills, diaphoresis, fever, malaise/fatigue and weight loss (up 4 lbs since 11/10/2018).       Feels "good".  Anemia symptoms are returning.  HENT: Negative.  Negative for congestion, ear discharge, ear pain, nosebleeds, sinus pain, sore throat and tinnitus.        Broken teeth related to grinding.  Eyes: Negative.  Negative for blurred vision, double vision, photophobia and pain.  Respiratory: Negative.  Negative for cough, hemoptysis, sputum production and shortness of breath.   Cardiovascular: Positive for palpitations. Negative for chest pain, orthopnea, leg swelling and PND.       Wearing a heart monitor.  Gastrointestinal: Positive for melena (on oral iron). Negative for abdominal pain, blood in stool, constipation, diarrhea, nausea and vomiting.       Good diet. Cooks at home. Eating iron rich foods. Ice pica. Craves salt.  Genitourinary: Negative for dysuria, frequency, hematuria and urgency.       Heavy menses; last 10 days. IUD. Using menses cup. Vaginal hysterectomy (summer 2021).  Musculoskeletal: Positive for joint pain (jaw pain related to grinding teeth). Negative for back pain, falls, myalgias and neck pain.  Skin: Negative.  Negative for itching and rash.  Neurological: Negative.  Negative for dizziness, tingling, tremors, sensory change, speech change, focal weakness, weakness and headaches.       Restless  legs.  Endo/Heme/Allergies: Negative.  Does not bruise/bleed easily.  Psychiatric/Behavioral: Negative.  Negative for depression and memory loss. The patient is not nervous/anxious.   All other systems reviewed and are negative.  Performance status (ECOG): 0-1  Vitals Blood pressure 110/70, pulse (!) 57, temperature (!) 97.3 F (36.3 C), temperature source Tympanic, resp. rate 18, height 5\' 6"  (1.676 m), weight 154 lb 12.2 oz (70.2 kg), last menstrual period 12/28/2019, SpO2 100 %.   Physical Exam  Constitutional: She is oriented to person, place, and time. She appears well-developed and well-nourished. No distress.  HENT:  Head: Normocephalic and atraumatic.  Mouth/Throat: Oropharynx is clear and moist. No oropharyngeal exudate.  Blonde hair pulled back.  Eyes: Pupils are equal, round, and reactive to light. Conjunctivae and EOM are normal. No scleral icterus.  Blue eyes.  Neck: No JVD present.  Cardiovascular: Normal rate, regular rhythm and normal heart sounds.  No murmur heard. Pulmonary/Chest: Effort normal and breath sounds normal. No respiratory distress. She has no  wheezes. She has no rales. She exhibits no tenderness.  Left upper chest wall cardiac monitor.  Abdominal: Soft. Bowel sounds are normal. She exhibits no distension and no mass. There is no abdominal tenderness. There is no rebound and no guarding.  Musculoskeletal:        General: No tenderness or edema. Normal range of motion.     Cervical back: Normal range of motion and neck supple.  Lymphadenopathy:       Head (right side): No preauricular, no posterior auricular and no occipital adenopathy present.       Head (left side): No preauricular, no posterior auricular and no occipital adenopathy present.    She has no cervical adenopathy.    She has no axillary adenopathy.       Right: No inguinal and no supraclavicular adenopathy present.       Left: No inguinal and no supraclavicular adenopathy present.   Neurological: She is alert and oriented to person, place, and time.  Skin: Skin is warm and dry. She is not diaphoretic.  Psychiatric: She has a normal mood and affect. Her behavior is normal. Judgment and thought content normal.  Nursing note and vitals reviewed.   No visits with results within 3 Day(s) from this visit.  Latest known visit with results is:  Office Visit on 01/10/2020  Component Date Value Ref Range Status  . TSH 01/10/2020 0.830  0.450 - 4.500 uIU/mL Final  . Free T4 01/10/2020 1.02  0.82 - 1.77 ng/dL Final  . WBC 01/10/2020 8.7  3.4 - 10.8 x10E3/uL Final  . RBC 01/10/2020 4.72  3.77 - 5.28 x10E6/uL Final  . Hemoglobin 01/10/2020 13.3  11.1 - 15.9 g/dL Final  . Hematocrit 01/10/2020 40.3  34.0 - 46.6 % Final  . MCV 01/10/2020 85  79 - 97 fL Final  . MCH 01/10/2020 28.2  26.6 - 33.0 pg Final  . MCHC 01/10/2020 33.0  31.5 - 35.7 g/dL Final  . RDW 01/10/2020 12.8  11.7 - 15.4 % Final  . Platelets 01/10/2020 368  150 - 450 x10E3/uL Final  . Ferritin 01/10/2020 10* 15 - 150 ng/mL Final  . Total Iron Binding Capacity 01/10/2020 379  250 - 450 ug/dL Final  . UIBC 01/10/2020 339  131 - 425 ug/dL Final  . Iron 01/10/2020 40  27 - 159 ug/dL Final  . Iron Saturation 01/10/2020 11* 15 - 55 % Final  . Vitamin B-12 01/10/2020 441  232 - 1,245 pg/mL Final  . High risk HPV 01/10/2020 Negative   Final  . Neisseria Gonorrhea 01/10/2020 Negative   Final  . Chlamydia 01/10/2020 Negative   Final  . Adequacy 01/10/2020 Satisfactory for evaluation; transformation zone component PRESENT.   Final  . Diagnosis 01/10/2020 - Atypical squamous cells of undetermined significance (ASC-US)*  Final  . Comment 01/10/2020 Normal Reference Range HPV - Negative   Final  . Comment 01/10/2020 Normal Reference Ranger Chlamydia - Negative   Final  . Comment 01/10/2020 Normal Reference Range Neisseria Gonorrhea - Negative   Final    Assessment:  Sherri Fitzgerald is a 34 y.o. female with iron deficiency  anemia secondary to multiple pregnancies and symptomatic bleeding hemorrhoids.  She has had grade 3 hemorrhoids since age of 21.  She is s/p rubber band ligation of the hemorrhoids x 5.  She underwent hemorrhoide[facility] on 06/28/2018 at [facility].  She has menorrhagia.  She has received IV iron in the past at [facility] - Venofer 500 mg IV with APAP  650 mg, diphenhydramine 25 IV, and famotidine 20 mg IV x 2 doses (04/09/2017 and 04/14/2017).  She received Venofer at [facility]:  weekly x 4 (07/29/2018 - 08/19/2018).  She has been on oral iron.  Ferritin has been followed.  6 on 04/09/2017, 142 on 04/23/2017, 5 on 06/21/2018, 3 on 07/29/2018, 20 on 08/05/2018, 48 on 08/12/2018, 62 on 08/19/2018, 13 on 11/09/2018 and 10 on 01/10/2020.  Symptomatically, menses is heavy.  She denies any melena, hematochezia or hematuria.  She has ice pica and is craving salt.  She has restless legs.  Plan: 1.   Review labs from 01/18/2020. 2.   Urine pregnancy test. 3.   Iron deficiency              Etiology initially secondary to hemorrhoidal bleeding.              Patient s/p hemorrhoidectomy.             She has significant menorrhagia.   She currently has an IUD and is considering hysterectomy. She has recurrent symptoms of anemia despite oral iron BID. Hematocrit 40.3.  Hemoglobin 13.3.  MCV 85.   Ferritin 10 with an iron saturation of 11% and a TIBC 379.  Ferritin goal 100. Patient interested in pursuing IV iron.  Venofer today and weekly x 2 (total 3) + pregnancy test. 4.   RTC in 2 months for labs (CBC, ferritin). 5.   RTC in 6 months for MD assessment, labs (CBC with diff, ferritin- day before) and +/- Venofer.  I discussed the assessment and treatment plan with the patient.  The patient was provided an opportunity to ask questions and all were answered.  The patient agreed with the plan and demonstrated an understanding of the instructions.  The patient was advised to call back if the symptoms worsen or if the  condition fails to improve as anticipated.   Lequita Asal, MD, PhD    01/20/2020, 9:34 AM  I, Selena Batten, am acting as scribe for [employer]. Mike Gip, MD, PhD.  I, Coryn Mosso C. Mike Gip, MD, have reviewed the above documentation for accuracy and completeness, and I agree with the above.

## 2020-01-17 NOTE — Patient Instructions (Signed)
Medication Instructions:  Your physician recommends that you continue on your current medications as directed. Please refer to the Current Medication list given to you today.  *If you need a refill on your cardiac medications before your next appointment, please call your pharmacy*  Lab Work: None ordered If you have labs (blood work) drawn today and your tests are completely normal, you will receive your results only by: Marland Kitchen MyChart Message (if you have MyChart) OR . A paper copy in the mail If you have any lab test that is abnormal or we need to change your treatment, we will call you to review the results.  Testing/Procedures: Your physician has requested that you have an echocardiogram. Echocardiography is a painless test that uses sound waves to create images of your heart. It provides your doctor with information about the size and shape of your heart and how well your heart's chambers and valves are working. This procedure takes approximately one hour. There are no restrictions for this procedure.   Your physician has recommended that you wear a Zio monitor. This monitor is a medical device that records the heart's electrical activity. Doctors most often use these monitors to diagnose arrhythmias. Arrhythmias are problems with the speed or rhythm of the heartbeat. The monitor is a small device applied to your chest. You can wear one while you do your normal daily activities. While wearing this monitor if you have any symptoms to push the button and record what you felt. Once you have worn this monitor for the period of time provider prescribed (Usually 14 days), you will return the monitor device in the postage paid box. Once it is returned they will download the data collected and provide [place] with a report which the provider will then review and we will call you with those results. Important tips:  1. Avoid showering during the first 24 hours of wearing the monitor. 2. Avoid excessive  sweating to help maximize wear time. 3. Do not submerge the device, no hot tubs, and no swimming pools. 4. Keep any lotions or oils away from the patch. 5. After 24 hours you may shower with the patch on. Take brief showers with your back facing the shower head.  6. Do not remove patch once it has been placed because that will interrupt data and decrease adhesive wear time. 7. Push the button when you have any symptoms and write down what you were feeling. 8. Once you have completed wearing your monitor, remove and place into box which has postage paid and place in your outgoing mailbox.  9. If for some reason you have misplaced your box then call our office and we can provide another box and/or mail it off for you.        Follow-Up: At [facility], you and your health needs are our priority.  As part of our continuing mission to provide you with exceptional heart care, we have created designated Provider Care Teams.  These Care Teams include your primary Cardiologist (physician) and Advanced Practice Providers (APPs -  Physician Assistants and Nurse Practitioners) who all work together to provide you with the care you need, when you need it.  Your next appointment:   As needed   The format for your next appointment:   In Person  Provider:    You may see Dr. Fletcher Anon or one of the following Advanced Practice Providers on your designated Care Team:    Murray Hodgkins, NP  Christell Faith, PA-C  Malachi Bonds  Mickle Plumb, PA-C   Other Instructions N/A

## 2020-01-17 NOTE — Progress Notes (Signed)
Cardiology Office Note   Date:  01/17/2020   ID:  Sherri Fitzgerald, DOB 30-Jan-1986, MRN [id number]  PCP:  Dionne Ano, CNM  Cardiologist:   Kathlyn Sacramento, MD   Chief Complaint  Patient presents with  . New Patient (Initial Visit)    Ref by Dr. Gilman Schmidt for irreg. heart beats. Meds reviewed by the pt. verbally. Pt. c/o irreg. heart beats and symptoms of heart fluttering.       History of Present Illness: Sherri Fitzgerald is a 34 y.o. female who was referred by Dr. Gilman Schmidt for evaluation of palpitations.  The patient reports strong family history of hypertrophic cardiomyopathy.  Her mother had it and also her maternal grandfather.  She was screened for it at the age of 6 with an EKG and echocardiogram and both were unremarkable.  She was told about a heart murmur at some point.  She is otherwise healthy.  She does have history of iron deficiency anemia due to menorrhagia.  She is not a smoker.  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 She reports almost daily palpitations described as skipping.  In addition, she had 2 episodes of brief tachycardia over the last 8 months.  She denies dizziness, syncope or presyncope.  She does have occasional short-lived chest discomfort but no significant shortness of breath.    Past Medical History:  Diagnosis Date  . Anemia     Past Surgical History:  Procedure Laterality Date  . CESAREAN SECTION  2006  . FLEXIBLE SIGMOIDOSCOPY N/A 06/25/2018   Procedure: FLEXIBLE SIGMOIDOSCOPY;  Surgeon: Lin Landsman, MD;  Location: [facility] ENDOSCOPY;  Service: Gastroenterology;  Laterality: N/A;  . HEMORROIDECTOMY  06/2018     Current Outpatient Medications  Medication Sig Dispense Refill  . ferrous sulfate 325 (65 FE) MG EC tablet Take 325 mg by mouth daily with breakfast.    . Multiple Vitamins-Minerals (MULTIVITAMIN ADULT PO) multivitamin    . PARAGARD INTRAUTERINE COPPER IUD IUD ParaGard T 380A 380 square mm intrauterine device  Take 1  device by intrauterine route.    . Pyridoxine HCl (VITAMIN B-6) 500 MG tablet Take 500 mg by mouth daily.     No current facility-administered medications for this visit.    Allergies:   Patient has no known allergies.    Social History:  The patient  reports that she has never smoked. She has never used smokeless tobacco. She reports current alcohol use. She reports that she does not use drugs.   Family History:  The patient's family history includes Cancer in her paternal grandmother; Heart attack in her mother and paternal grandfather; Heart disease in her mother and paternal grandfather; Heart failure in her paternal grandfather; Hypertension in her mother; Hypertrophic cardiomyopathy in her mother.    ROS:  Please see the history of present illness.   Otherwise, review of systems are positive for none.   All other systems are reviewed and negative.    PHYSICAL EXAM: VS:  BP 120/72 (BP Location: Right Arm, Patient Position: Sitting, Cuff Size: Normal)   Pulse 77   Ht 5\' 6"  (1.676 m)   Wt 157 lb (71.2 kg)   LMP 12/28/2019 (Exact Date)   SpO2 98%   BMI 25.34 kg/m  , BMI Body mass index is 25.34 kg/m. GEN: Well nourished, well developed, in no acute distress  HEENT: normal  Neck: no JVD, carotid bruits, or masses Cardiac: RRR; no  rubs, or gallops,no edema .  2 out of  6 systolic murmur in the pulmonic area which becomes softer with Valsalva maneuver Respiratory:  clear to auscultation bilaterally, normal work of breathing GI: soft, nontender, nondistended, + BS MS: no deformity or atrophy  Skin: warm and dry, no rash Neuro:  Strength and sensation are intact Psych: euthymic mood, full affect   EKG:  EKG is ordered today. The ekg ordered today demonstrates normal sinus rhythm with no significant ST or T wave changes.   Recent Labs: 01/10/2020: Hemoglobin 13.3; Platelets 368; TSH 0.830    Lipid Panel No results found for: CHOL, TRIG, HDL, CHOLHDL, VLDL, LDLCALC,  LDLDIRECT    Wt Readings from Last 3 Encounters:  01/17/20 157 lb (71.2 kg)  01/10/20 156 lb (70.8 kg)  11/10/18 150 lb 14.5 oz (68.5 kg)        PAD Screen 01/17/2020  Previous PAD dx? No  Previous surgical procedure? No  Pain with walking? No  Feet/toe relief with dangling? No  Painful, non-healing ulcers? No  Extremities discolored? No      ASSESSMENT AND PLAN:  1.  Palpitations: Suspect premature beats.  I am going to obtain a 2-week outpatient monitor.  2.  Cardiac murmur: The murmur is not consistent with hypertrophic cardiomyopathy as it is softer with Valsalva maneuver.  I requested an echocardiogram especially that she has a strong family history of hypertrophic cardiomyopathy.    Disposition:   FU with me as needed.   Signed,  Kathlyn Sacramento, MD  01/17/2020 2:06 PM    [facility]

## 2020-01-19 ENCOUNTER — Encounter: Payer: Self-pay | Admitting: Hematology and Oncology

## 2020-01-19 ENCOUNTER — Other Ambulatory Visit: Payer: Self-pay

## 2020-01-19 NOTE — Progress Notes (Signed)
No new changes noted today. The patient Name and DOB has been verified by phone. 

## 2020-01-20 ENCOUNTER — Inpatient Hospital Stay: Payer: Medicaid Other

## 2020-01-20 ENCOUNTER — Inpatient Hospital Stay: Payer: Medicaid Other | Attending: Hematology and Oncology | Admitting: Hematology and Oncology

## 2020-01-20 ENCOUNTER — Encounter: Payer: Self-pay | Admitting: Hematology and Oncology

## 2020-01-20 VITALS — BP 110/70 | HR 57 | Temp 97.3°F | Resp 18 | Ht 66.0 in | Wt 154.8 lb

## 2020-01-20 VITALS — BP 114/71 | HR 70 | Resp 18

## 2020-01-20 DIAGNOSIS — D509 Iron deficiency anemia, unspecified: Secondary | ICD-10-CM

## 2020-01-20 DIAGNOSIS — N920 Excessive and frequent menstruation with regular cycle: Secondary | ICD-10-CM

## 2020-01-20 LAB — PREGNANCY, URINE: Preg Test, Ur: NEGATIVE

## 2020-01-20 MED ORDER — SODIUM CHLORIDE 0.9 % IV SOLN: 200.0000 mg | Freq: Once | INTRAVENOUS | Status: DC

## 2020-01-20 MED ORDER — SODIUM CHLORIDE 0.9% FLUSH
3.0000 mL | Freq: Once | INTRAVENOUS | Status: DC | PRN
  Filled 2020-01-20: qty 3

## 2020-01-20 MED ORDER — SODIUM CHLORIDE 0.9% FLUSH
10.0000 mL | Freq: Once | INTRAVENOUS | Status: DC | PRN
  Filled 2020-01-20: qty 10

## 2020-01-20 MED ORDER — SODIUM CHLORIDE 0.9 % IV SOLN
Freq: Once | INTRAVENOUS | Status: AC
  Filled 2020-01-20: qty 250

## 2020-01-20 MED ORDER — IRON SUCROSE 20 MG/ML IV SOLN
200.0000 mg | Freq: Once | INTRAVENOUS | Status: AC
  Administered 2020-01-20: 200 mg via INTRAVENOUS

## 2020-01-20 NOTE — Patient Instructions (Signed)

## 2020-01-25 ENCOUNTER — Ambulatory Visit (INDEPENDENT_AMBULATORY_CARE_PROVIDER_SITE_OTHER): Payer: Medicaid Other | Admitting: Obstetrics and Gynecology

## 2020-01-25 ENCOUNTER — Other Ambulatory Visit: Payer: Self-pay

## 2020-01-25 ENCOUNTER — Encounter: Payer: Self-pay | Admitting: Obstetrics and Gynecology

## 2020-01-25 ENCOUNTER — Ambulatory Visit (INDEPENDENT_AMBULATORY_CARE_PROVIDER_SITE_OTHER): Payer: Medicaid Other

## 2020-01-25 VITALS — BP 100/62 | Ht 66.0 in | Wt 155.0 lb

## 2020-01-25 DIAGNOSIS — N921 Excessive and frequent menstruation with irregular cycle: Secondary | ICD-10-CM

## 2020-01-25 DIAGNOSIS — Z975 Presence of (intrauterine) contraceptive device: Secondary | ICD-10-CM

## 2020-01-25 DIAGNOSIS — D500 Iron deficiency anemia secondary to blood loss (chronic): Secondary | ICD-10-CM | POA: Diagnosis not present

## 2020-01-25 NOTE — Progress Notes (Signed)
Patient ID: Sherri Fitzgerald, female   DOB: 1986/04/14, 34 y.o.   MRN: [id number]  Reason for Consult: Follow-up (GYN U/S follow up )   Referred by Cleora Karnik R, *  Subjective:     HPI:  Sherri Fitzgerald is a 34 y.o. female who returns today for follow-up.  She has seen hematology as well as cardiology.  She has started IV infusions and is currently wearing a heart monitor to monitor for palpitations.  She has an ECHO planned for February 23, 2020.  She had a pelvic ultrasound today which was normal.  No fibroids or polyps were seen to be contributing to her abnormal uterine bleeding.  Endometrium was normal thickness and IUD was correctly placed within the uterus.   She reports that when she is on her period she will have 25 cc in her menstrual cup every 2 hours as well as overflow onto pads.  Given her multiple complications of menorrhagia such as restless leg, grinding of teeth, and pica coupled with her aversion to options for medical therapy on religious and family history grounds she desires definitive therapy with a hysterectomy.   Past Medical History:  Diagnosis Date  . Anemia    Family History  Problem Relation Age of Onset  . Cancer Paternal Grandmother   . Heart disease Mother        stent   . Heart attack Mother   . Hypertrophic cardiomyopathy Mother   . Hypertension Mother   . Heart failure Paternal Grandfather   . Heart disease Paternal Grandfather   . Heart attack Paternal Grandfather    Past Surgical History:  Procedure Laterality Date  . CESAREAN SECTION  2006  . FLEXIBLE SIGMOIDOSCOPY N/A 06/25/2018   Procedure: FLEXIBLE SIGMOIDOSCOPY;  Surgeon: Lin Landsman, MD;  Location: [facility] ENDOSCOPY;  Service: Gastroenterology;  Laterality: N/A;  . HEMORROIDECTOMY  06/2018    Short Social History:  Social History   Tobacco Use  . Smoking status: Never Smoker  . Smokeless tobacco: Never Used  Substance Use Topics  . Alcohol use: Yes    Comment:  ocassionally    No Known Allergies  Current Outpatient Medications  Medication Sig Dispense Refill  . ferrous sulfate 325 (65 FE) MG EC tablet Take 325 mg by mouth daily with breakfast.    . Multiple Vitamins-Minerals (MULTIVITAMIN ADULT PO) multivitamin    . PARAGARD INTRAUTERINE COPPER IUD IUD ParaGard T 380A 380 square mm intrauterine device  Take 1 device by intrauterine route.    . Pyridoxine HCl (VITAMIN B-6) 500 MG tablet Take 500 mg by mouth daily.     No current facility-administered medications for this visit.    Review of Systems  Constitutional: Negative for chills, fatigue, fever and unexpected weight change.  HENT: Negative for trouble swallowing.  Eyes: Negative for loss of vision.  Respiratory: Negative for cough, shortness of breath and wheezing.  Cardiovascular: Negative for chest pain, leg swelling, palpitations and syncope.  GI: Negative for abdominal pain, blood in stool, diarrhea, nausea and vomiting.  GU: Negative for difficulty urinating, dysuria, frequency and hematuria.  Musculoskeletal: Negative for back pain, leg pain and joint pain.  Skin: Negative for rash.  Neurological: Negative for dizziness, headaches, light-headedness, numbness and seizures.  Psychiatric: Negative for behavioral problem, confusion, depressed mood and sleep disturbance.        Objective:  Objective   Vitals:   01/25/20 1448  BP: 100/62  Weight: 155 lb (70.3 kg)  Height: 5\' 6"  (1.676  m)   Body mass index is 25.02 kg/m.  Physical Exam Vitals and nursing note reviewed.  Constitutional:      Appearance: She is well-developed.  HENT:     Head: Normocephalic and atraumatic.  Eyes:     Pupils: Pupils are equal, round, and reactive to light.  Cardiovascular:     Rate and Rhythm: Normal rate and regular rhythm.  Pulmonary:     Effort: Pulmonary effort is normal. No respiratory distress.  Skin:    General: Skin is warm and dry.  Neurological:     Mental Status: She is  alert and oriented to person, place, and time.  Psychiatric:        Behavior: Behavior normal.        Thought Content: Thought content normal.        Judgment: Judgment normal.        Assessment/Plan:     34 year old with menorrhagia Have discussed with patient multiple options for medical and minimally invasive ways of managing bleeding.  She desires definitive therapy for her menorrhagia by a hysterectomy.  We have discussed multiple routes of hysterectomy and she is a good candidate for vaginal hysterectomy which is considered the gold standard.  She understands that sometimes there can be difficulty removing the fallopian tubes at the time of the surgery but if it is surgically feasible the fallopian tubes will be removed is all as this can contribute to lower lifetime risk of ovarian cancer.  She will retain her ovaries and she understands that she will continue to need annual pelvic exams.  She is very motivated to have her hysterectomy in the next 1 to 2 months, ideally before the season when her family will be busy and May and she will have less help in the house.  Will attempt implant procedure for early April as this is after the time that she will have her echocardiogram performed.  Note sent to surgical scheduler to call patient and schedule the procedure.  More than 25 minutes were spent face to face with the patient in the room with more than 50% of the time spent providing counseling and discussing the plan of management.      Adrian Prows MD [facility], [facility] 01/25/2020 3:19 PM

## 2020-01-25 NOTE — H&P (View-Only) (Signed)
Patient ID: Sherri Fitzgerald, female   DOB: 1986/03/17, 34 y.o.   MRN: [id number]  Reason for Consult: Follow-up (GYN U/S follow up )   Referred by Jerzie Bieri R, *  Subjective:     HPI:  Sherri Fitzgerald is a 34 y.o. female who returns today for follow-up.  She has seen hematology as well as cardiology.  She has started IV infusions and is currently wearing a heart monitor to monitor for palpitations.  She has an ECHO planned for February 23, 2020.  She had a pelvic ultrasound today which was normal.  No fibroids or polyps were seen to be contributing to her abnormal uterine bleeding.  Endometrium was normal thickness and IUD was correctly placed within the uterus.   She reports that when she is on her period she will have 25 cc in her menstrual cup every 2 hours as well as overflow onto pads.  Given her multiple complications of menorrhagia such as restless leg, grinding of teeth, and pica coupled with her aversion to options for medical therapy on religious and family history grounds she desires definitive therapy with a hysterectomy.   Past Medical History:  Diagnosis Date  . Anemia    Family History  Problem Relation Age of Onset  . Cancer Paternal Grandmother   . Heart disease Mother        stent   . Heart attack Mother   . Hypertrophic cardiomyopathy Mother   . Hypertension Mother   . Heart failure Paternal Grandfather   . Heart disease Paternal Grandfather   . Heart attack Paternal Grandfather    Past Surgical History:  Procedure Laterality Date  . CESAREAN SECTION  2006  . FLEXIBLE SIGMOIDOSCOPY N/A 06/25/2018   Procedure: FLEXIBLE SIGMOIDOSCOPY;  Surgeon: Lin Landsman, MD;  Location: [facility] ENDOSCOPY;  Service: Gastroenterology;  Laterality: N/A;  . HEMORROIDECTOMY  06/2018    Short Social History:  Social History   Tobacco Use  . Smoking status: Never Smoker  . Smokeless tobacco: Never Used  Substance Use Topics  . Alcohol use: Yes    Comment:  ocassionally    No Known Allergies  Current Outpatient Medications  Medication Sig Dispense Refill  . ferrous sulfate 325 (65 FE) MG EC tablet Take 325 mg by mouth daily with breakfast.    . Multiple Vitamins-Minerals (MULTIVITAMIN ADULT PO) multivitamin    . PARAGARD INTRAUTERINE COPPER IUD IUD ParaGard T 380A 380 square mm intrauterine device  Take 1 device by intrauterine route.    . Pyridoxine HCl (VITAMIN B-6) 500 MG tablet Take 500 mg by mouth daily.     No current facility-administered medications for this visit.    Review of Systems  Constitutional: Negative for chills, fatigue, fever and unexpected weight change.  HENT: Negative for trouble swallowing.  Eyes: Negative for loss of vision.  Respiratory: Negative for cough, shortness of breath and wheezing.  Cardiovascular: Negative for chest pain, leg swelling, palpitations and syncope.  GI: Negative for abdominal pain, blood in stool, diarrhea, nausea and vomiting.  GU: Negative for difficulty urinating, dysuria, frequency and hematuria.  Musculoskeletal: Negative for back pain, leg pain and joint pain.  Skin: Negative for rash.  Neurological: Negative for dizziness, headaches, light-headedness, numbness and seizures.  Psychiatric: Negative for behavioral problem, confusion, depressed mood and sleep disturbance.        Objective:  Objective   Vitals:   01/25/20 1448  BP: 100/62  Weight: 155 lb (70.3 kg)  Height: 5\' 6"  (1.676  m)   Body mass index is 25.02 kg/m.  Physical Exam Vitals and nursing note reviewed.  Constitutional:      Appearance: She is well-developed.  HENT:     Head: Normocephalic and atraumatic.  Eyes:     Pupils: Pupils are equal, round, and reactive to light.  Cardiovascular:     Rate and Rhythm: Normal rate and regular rhythm.  Pulmonary:     Effort: Pulmonary effort is normal. No respiratory distress.  Skin:    General: Skin is warm and dry.  Neurological:     Mental Status: She is  alert and oriented to person, place, and time.  Psychiatric:        Behavior: Behavior normal.        Thought Content: Thought content normal.        Judgment: Judgment normal.        Assessment/Plan:     34 year old with menorrhagia Have discussed with patient multiple options for medical and minimally invasive ways of managing bleeding.  She desires definitive therapy for her menorrhagia by a hysterectomy.  We have discussed multiple routes of hysterectomy and she is a good candidate for vaginal hysterectomy which is considered the gold standard.  She understands that sometimes there can be difficulty removing the fallopian tubes at the time of the surgery but if it is surgically feasible the fallopian tubes will be removed is all as this can contribute to lower lifetime risk of ovarian cancer.  She will retain her ovaries and she understands that she will continue to need annual pelvic exams.  She is very motivated to have her hysterectomy in the next 1 to 2 months, ideally before the season when her family will be busy and May and she will have less help in the house.  Will attempt implant procedure for early April as this is after the time that she will have her echocardiogram performed.  Note sent to surgical scheduler to call patient and schedule the procedure.  More than 25 minutes were spent face to face with the patient in the room with more than 50% of the time spent providing counseling and discussing the plan of management.      Adrian Prows MD [facility], [facility] 01/25/2020 3:19 PM

## 2020-01-27 ENCOUNTER — Other Ambulatory Visit: Payer: Self-pay

## 2020-01-27 ENCOUNTER — Inpatient Hospital Stay: Payer: Medicaid Other

## 2020-01-27 ENCOUNTER — Inpatient Hospital Stay: Payer: Medicaid Other | Attending: Hematology and Oncology

## 2020-01-27 VITALS — BP 113/69 | HR 66 | Temp 97.4°F | Resp 18

## 2020-01-27 DIAGNOSIS — K649 Unspecified hemorrhoids: Secondary | ICD-10-CM | POA: Diagnosis not present

## 2020-01-27 DIAGNOSIS — D500 Iron deficiency anemia secondary to blood loss (chronic): Secondary | ICD-10-CM | POA: Insufficient documentation

## 2020-01-27 DIAGNOSIS — D509 Iron deficiency anemia, unspecified: Secondary | ICD-10-CM

## 2020-01-27 LAB — PREGNANCY, URINE: Preg Test, Ur: NEGATIVE

## 2020-01-27 MED ORDER — IRON SUCROSE 20 MG/ML IV SOLN
200.0000 mg | Freq: Once | INTRAVENOUS | Status: AC
  Administered 2020-01-27: 200 mg via INTRAVENOUS

## 2020-01-27 MED ORDER — SODIUM CHLORIDE 0.9 % IV SOLN
Freq: Once | INTRAVENOUS | Status: AC
  Filled 2020-01-27: qty 250

## 2020-01-27 MED ORDER — SODIUM CHLORIDE 0.9 % IV SOLN: 200.0000 mg | Freq: Once | INTRAVENOUS | Status: DC

## 2020-01-27 NOTE — Patient Instructions (Signed)

## 2020-01-30 ENCOUNTER — Telehealth: Payer: Self-pay | Admitting: Obstetrics and Gynecology

## 2020-01-30 ENCOUNTER — Telehealth: Payer: Self-pay | Admitting: Cardiovascular Disease

## 2020-01-30 NOTE — Telephone Encounter (Signed)
Scheduled surgery for 4/13 with Dr Gilman Schmidt H&P 3/31 @ 1:50pm Covid testing 4/9, [address], drive up and wear a mask. Adv pt to quar until day of surgery.  Adv pt of Pre admit phone call appt. It will be updated on pt MyChart and also listed on paperwork recd at H&P. Also may rec calls from pt center and/or pharmacy.

## 2020-01-30 NOTE — Telephone Encounter (Signed)
She needs her echo done before surgery.

## 2020-01-30 NOTE — Telephone Encounter (Signed)
Rqst fwd to scheduling

## 2020-01-30 NOTE — Telephone Encounter (Signed)
Scheduled 3/11

## 2020-01-30 NOTE — Telephone Encounter (Signed)
° °  [facility] Pre-operative Risk Assessment    Request for surgical clearance:  1. What type of surgery is being performed? Total Laparoscopic Hysterectomy Bilateral Salpingectomy   2. When is this surgery scheduled? 03/06/20  3. What type of clearance is required (medical clearance vs. Pharmacy clearance to hold med vs. Both)? both  4. Are there any medications that need to be held prior to surgery and how long? Not listed, please advise if needed.  5. Practice name and name of physician performing surgery? [facility], Dr Homero Fellers  6. What is your office phone number [phone]   7.   What is your office fax number [phone]  8.   Anesthesia type (None, local, MAC, general) ? Not listed   Sherri Fitzgerald 01/30/2020, 4:49 PM  _________________________________________________________________   (provider comments below)

## 2020-01-30 NOTE — Telephone Encounter (Signed)
-----   Message from Homero Fellers, MD sent at 01/25/2020  3:17 PM EST ----- Surgery Booking Request Patient Full Name:  Sherri Fitzgerald  MRN: [id number]  DOB: 24-Dec-1985  Surgeon: Homero Fellers, MD  Requested Surgery Date and Time: Early April 2021 Primary Diagnosis AND Code: N92.0- menorrhagia Secondary Diagnosis and Code:  Surgical Procedure: TVH/BS L&D Notification: No Admission Status: surgery admit Length of Surgery: 3 hours Special Case Needs: No H&P: Yes Phone Interview???:  Yes Interpreter: No Language:  Medical Clearance:  Yes- cardiac lcearance Special Scheduling Instructions: none Any known health/anesthesia issues, diabetes, sleep apnea, latex allergy, defibrillator/pacemaker?: No Acuity: P3   (P1 highest, P2 delay may cause harm, P3 low, elective gyn, P4 lowest)

## 2020-01-30 NOTE — Telephone Encounter (Signed)
LM for pt to rtn call. 

## 2020-01-30 NOTE — Telephone Encounter (Signed)
-----   Message from Homero Fellers, MD sent at 01/25/2020  3:17 PM EST ----- Surgery Booking Request Patient Full Name:  Sherri Fitzgerald  MRN: [id number]  DOB: Oct 23, 1986  Surgeon: Homero Fellers, MD  Requested Surgery Date and Time: Early April 2021 Primary Diagnosis AND Code: N92.0- menorrhagia Secondary Diagnosis and Code:  Surgical Procedure: TVH/BS L&D Notification: No Admission Status: surgery admit Length of Surgery: 3 hours Special Case Needs: No H&P: Yes Phone Interview???:  Yes Interpreter: No Language:  Medical Clearance:  Yes- cardiac lcearance Special Scheduling Instructions: none Any known health/anesthesia issues, diabetes, sleep apnea, latex allergy, defibrillator/pacemaker?: No Acuity: P3   (P1 highest, P2 delay may cause harm, P3 low, elective gyn, P4 lowest)

## 2020-01-31 NOTE — Telephone Encounter (Signed)
Pt called and requested DOS changed to earlier if possible. Was able to move her to [facility] 3/25.  H&P 3/12 @ 9:10 - Hysterectomy form needed to be signed at H&P appt. With other paperwork.  Covid testing 3/23, adv pt to quar until DOS Pt aware of testing location and instructions.  Pt having echo done 3/11. Adv pt that request for medical clearance had been faxed to cardiology on 01/30/20

## 2020-01-31 NOTE — Telephone Encounter (Signed)
Primary Cardiologist:Muhammad Fletcher Anon, MD  Chart reviewed as part of pre-operative protocol coverage. Because of Delphina Remillard's past medical history and time since last visit, he/she will require a follow-up visit in order to better assess preoperative cardiovascular risk.  Pre-op covering staff: - Please schedule echocardiogram - Please contact requesting surgeon's office via preferred method (i.e, phone, fax) to inform them of need for appointment prior to surgery. -Also waiting on cardiac monitor results.  If applicable, this message will also be routed to pharmacy pool and/or primary cardiologist for input on holding anticoagulant/antiplatelet agent as requested below so that this information is available at time of patient's appointment.    Jossie Ng. [facility] [place] [address] Office [phone] Fax [phone]

## 2020-02-02 ENCOUNTER — Other Ambulatory Visit: Payer: Self-pay

## 2020-02-02 ENCOUNTER — Ambulatory Visit (INDEPENDENT_AMBULATORY_CARE_PROVIDER_SITE_OTHER): Payer: Medicaid Other

## 2020-02-02 DIAGNOSIS — R011 Cardiac murmur, unspecified: Secondary | ICD-10-CM | POA: Diagnosis not present

## 2020-02-03 ENCOUNTER — Encounter: Payer: Self-pay | Admitting: Obstetrics and Gynecology

## 2020-02-03 ENCOUNTER — Inpatient Hospital Stay: Payer: Medicaid Other

## 2020-02-03 ENCOUNTER — Ambulatory Visit (INDEPENDENT_AMBULATORY_CARE_PROVIDER_SITE_OTHER): Payer: Medicaid Other | Admitting: Obstetrics and Gynecology

## 2020-02-03 VITALS — BP 116/66 | HR 68 | Resp 18

## 2020-02-03 VITALS — BP 102/68 | Ht 66.0 in | Wt 155.0 lb

## 2020-02-03 DIAGNOSIS — N921 Excessive and frequent menstruation with irregular cycle: Secondary | ICD-10-CM

## 2020-02-03 DIAGNOSIS — D500 Iron deficiency anemia secondary to blood loss (chronic): Secondary | ICD-10-CM

## 2020-02-03 DIAGNOSIS — D509 Iron deficiency anemia, unspecified: Secondary | ICD-10-CM

## 2020-02-03 DIAGNOSIS — Z01818 Encounter for other preprocedural examination: Secondary | ICD-10-CM

## 2020-02-03 LAB — PREGNANCY, URINE: Preg Test, Ur: POSITIVE — AB

## 2020-02-03 MED ORDER — SODIUM CHLORIDE 0.9 % IV SOLN: 200.0000 mg | Freq: Once | INTRAVENOUS | Status: DC

## 2020-02-03 MED ORDER — SODIUM CHLORIDE 0.9 % IV SOLN
Freq: Once | INTRAVENOUS | Status: AC
  Filled 2020-02-03: qty 250

## 2020-02-03 MED ORDER — IRON SUCROSE 20 MG/ML IV SOLN
200.0000 mg | Freq: Once | INTRAVENOUS | Status: AC
  Administered 2020-02-03: 200 mg via INTRAVENOUS

## 2020-02-03 NOTE — Telephone Encounter (Signed)
  Daune Perch, NP  Nurse Practitioner  Specialty:  Cardiology  Telephone Encounter     Signed  Encounter Date:  01/30/2020          Signed         Show:Clear all [x] Manual[x] Template[] Copied  Added by: [x] Daune Perch, NP  [] Hover for details    Primary Cardiologist: Kathlyn Sacramento, MD  Chart reviewed as part of pre-operative protocol coverage. Given past medical history and time since last visit, based on ACC/AHA guidelines, Sherri Fitzgerald would be at acceptable risk for the planned procedure without further cardiovascular testing.   She had a normal echocardiogram on 02/02/2020. I informed pt of normal echo and clearance for procedure.   I will route this recommendation to the requesting party via Epic fax function and remove from pre-op pool.  Please call with questions.  Daune Perch, NP 02/03/2020, 8:49 AM

## 2020-02-03 NOTE — Telephone Encounter (Signed)
   Primary Cardiologist: Kathlyn Sacramento, MD  Chart reviewed as part of pre-operative protocol coverage. Given past medical history and time since last visit, based on ACC/AHA guidelines, Sherri Fitzgerald would be at acceptable risk for the planned procedure without further cardiovascular testing.   She had a normal echocardiogram on 02/02/2020. I informed pt of normal echo and clearance for procedure.   I will route this recommendation to the requesting party via Epic fax function and remove from pre-op pool.  Please call with questions.  Daune Perch, NP 02/03/2020, 8:49 AM

## 2020-02-03 NOTE — Patient Instructions (Signed)
Vaginal Hysterectomy  A vaginal hysterectomy is a procedure to remove all or part of the uterus through a small incision in the vagina. In this procedure, your health care provider may remove your entire uterus, including the lower end (cervix). You may need a vaginal hysterectomy to treat:  Uterine fibroids.  A condition that causes the lining of the uterus to grow in other areas (endometriosis).  Problems with pelvic support.  Cancer of the cervix, ovaries, uterus, or tissue that lines the uterus (endometrium).  Excessive (dysfunctional) uterine bleeding. When removing your uterus, your health care provider may also remove the organs that produce eggs (ovaries) and the tubes that carry eggs to your uterus (fallopian tubes). After a vaginal hysterectomy, you will no longer be able to have a baby. You will also no longer get your menstrual period. Tell a health care provider about:  Any allergies you have.  All medicines you are taking, including vitamins, herbs, eye drops, creams, and over-the-counter medicines.  Any problems you or family members have had with anesthetic medicines.  Any blood disorders you have.  Any surgeries you have had.  Any medical conditions you have.  Whether you are pregnant or may be pregnant. What are the risks? Generally, this is a safe procedure. However, problems may occur, including:  Bleeding.  Infection.  A blood clot that forms in your leg and travels to your lungs (pulmonary embolism).  Damage to surrounding organs.  Pain during sex. What happens before the procedure?  Ask your health care provider what organs will be removed during surgery.  Ask your health care provider about: ? Changing or stopping your regular medicines. This is especially important if you are taking diabetes medicines or blood thinners. ? Taking medicines such as aspirin and ibuprofen. These medicines can thin your blood. Do not take these medicines before your  procedure if your health care provider instructs you not to.  Follow instructions from your health care provider about eating or drinking restrictions.  Do not use any tobacco products, such as cigarettes, chewing tobacco, and e-cigarettes. If you need help quitting, ask your health care provider.  Plan to have someone take you home after discharge from the hospital. What happens during the procedure?  To reduce your risk of infection: ? Your health care team will wash or sanitize their hands. ? Your skin will be washed with soap.  An IV tube will be inserted into one of your veins.  You may be given antibiotic medicine to help prevent infection.  You will be given one or more of the following: ? A medicine to help you relax (sedative). ? A medicine to numb the area (local anesthetic). ? A medicine to make you fall asleep (general anesthetic). ? A medicine that is injected into an area of your body to numb everything beyond the injection site (regional anesthetic).  Your surgeon will make an incision in your vagina.  Your surgeon will locate and remove all or part of your uterus.  Your ovaries and fallopian tubes may be removed at the same time.  The incision will be closed with stitches (sutures) that dissolve over time. The procedure may vary among health care providers and hospitals. What happens after the procedure?  Your blood pressure, heart rate, breathing rate, and blood oxygen level will be monitored often until the medicines you were given have worn off.  You will be encouraged to get up and walk around after a few hours to help prevent complications.  You may have IV tubes in place for a few days.  You will be given pain medicine as needed.  Do not drive for 24 hours if you were given a sedative. This information is not intended to replace advice given to you by your health care provider. Make sure you discuss any questions you have with your health care  provider. Document Revised: 07/03/2016 Document Reviewed: 11/25/2015 Elsevier Patient Education  2020 [employer]  

## 2020-02-03 NOTE — Patient Instructions (Signed)

## 2020-02-03 NOTE — Progress Notes (Signed)
Patient ID: Sherri Fitzgerald, female   DOB: [birth year], 34 y.o.   MRN: [id number]  Reason for Consult: Pre-op Exam   Referred by Adrian Prows R, *  Subjective:     HPI:  Sherri Fitzgerald is a 34 y.o. female. She presents today for a preoperative visit. She has been scheduled for a vaginal hysterectomy, bilateral salpingectomy because of her history of menorrhagia resulting in iron deficiency anemia.   Past Medical History:  Diagnosis Date  . Anemia    Family History  Problem Relation Age of Onset  . Cancer Paternal Grandmother   . Heart disease Mother        stent   . Heart attack Mother   . Hypertrophic cardiomyopathy Mother   . Hypertension Mother   . Heart failure Paternal Grandfather   . Heart disease Paternal Grandfather   . Heart attack Paternal Grandfather    Past Surgical History:  Procedure Laterality Date  . CESAREAN SECTION  2006  . FLEXIBLE SIGMOIDOSCOPY N/A 06/25/2018   Procedure: FLEXIBLE SIGMOIDOSCOPY;  Surgeon: Lin Landsman, MD;  Location: [facility] ENDOSCOPY;  Service: Gastroenterology;  Laterality: N/A;  . HEMORROIDECTOMY  06/2018    Short Social History:  Social History   Tobacco Use  . Smoking status: Never Smoker  . Smokeless tobacco: Never Used  Substance Use Topics  . Alcohol use: Yes    Comment: ocassionally    Allergies  Allergen Reactions  . Morphine Itching and Nausea And Vomiting    Current Outpatient Medications  Medication Sig Dispense Refill  . ferrous sulfate 325 (65 FE) MG EC tablet Take 325 mg by mouth daily with breakfast.    . Multiple Vitamins-Minerals (MULTIVITAMIN ADULT PO) Take 1 tablet by mouth daily.     Marland Kitchen PARAGARD INTRAUTERINE COPPER IUD IUD 1 each by Intrauterine route once.     . Pyridoxine HCl (VITAMIN B-6) 500 MG tablet Take 500 mg by mouth daily.     No current facility-administered medications for this visit.    Review of Systems  Constitutional: Positive for fatigue. Negative for chills, fever and  unexpected weight change.  HENT: Negative for trouble swallowing.  Eyes: Negative for loss of vision.  Respiratory: Negative for cough, shortness of breath and wheezing.  Cardiovascular: Negative for chest pain, leg swelling, palpitations and syncope.  GI: Negative for abdominal pain, blood in stool, diarrhea, nausea and vomiting.  GU: Negative for difficulty urinating, dysuria, frequency and hematuria.  Musculoskeletal: Negative for back pain, leg pain and joint pain.  Skin: Negative for rash.  Neurological: Negative for dizziness, headaches, light-headedness, numbness and seizures.  Psychiatric: Negative for behavioral problem, confusion, depressed mood and sleep disturbance.        Objective:  Objective   There were no vitals filed for this visit. There is no height or weight on file to calculate BMI.  Physical Exam Vitals and nursing note reviewed.  Constitutional:      Appearance: She is well-developed.  HENT:     Head: Normocephalic and atraumatic.  Eyes:     Pupils: Pupils are equal, round, and reactive to light.  Cardiovascular:     Rate and Rhythm: Normal rate and regular rhythm.  Pulmonary:     Effort: Pulmonary effort is normal. No respiratory distress.  Skin:    General: Skin is warm and dry.  Neurological:     Mental Status: She is alert and oriented to person, place, and time.  Psychiatric:        Behavior:  Behavior normal.        Thought Content: Thought content normal.        Judgment: Judgment normal.        Assessment/Plan:     34 yo with menorrhagia who desires definitive treatment with a vaginal hysterectomy. She has completed childbearing and understands a hysterectomy means that she can no longer carry a pregnancy.   I have had a careful discussion with this patient about all the options available and the risk/benefits of each. I have fully informed this patient that a hysterectomy may subject her to a variety of discomforts and risks: She  understands that most patients have surgery with little difficulty, but problems can happen ranging from minor to fatal. These include nausea, vomiting, pain, bleeding, infection, poor healing, hernia, wound separation, vaginal cuff separation or formation of adhesions. Unexpected reactions may occur from any drug or anesthetic given. Unintended injury may occur to other pelvic or abdominal structures such as Fallopian tubes, ovaries, bladder, ureter (tube from kidney to bladder), or bowel. Nerves going from the pelvis to the legs may be injured. Any such injury may require immediate or later additional surgery to correct the problem. Excessive blood loss requiring transfusion is very unlikely but possible. Dangerous blood clots may form in the legs or lungs. Physical and sexual activity will be restricted in varying degrees for an indeterminate period of time but most often 2-4 weeks. She understands that the plan is to do this laparoscopically, however, there is a chance that this will need to be performed via a larger incision. She may be hospitalized overnight. Finally, she understands that it is impossible to list every possible undesirable effect and that the condition for which surgery is done is not always cured or significantly improved, and in rare cases may be even worse. I have also counseled her extensively about the pros and cons of ovarian conservation versus removal. Ample time was given to answer all questions.  Will plan to proceed with vaginal hysterectomy, bilateral salpingectomy.    Adrian Prows MD [facility] 02/03/2020 9:23 AM

## 2020-02-05 ENCOUNTER — Encounter: Payer: Self-pay | Admitting: Hematology and Oncology

## 2020-02-06 ENCOUNTER — Telehealth: Payer: Self-pay | Admitting: Hematology and Oncology

## 2020-02-06 ENCOUNTER — Other Ambulatory Visit: Payer: Self-pay

## 2020-02-06 ENCOUNTER — Other Ambulatory Visit: Payer: Self-pay | Admitting: Hematology and Oncology

## 2020-02-06 DIAGNOSIS — D509 Iron deficiency anemia, unspecified: Secondary | ICD-10-CM

## 2020-02-06 DIAGNOSIS — Z3201 Encounter for pregnancy test, result positive: Secondary | ICD-10-CM

## 2020-02-06 NOTE — Telephone Encounter (Signed)
Re:  Positive urine pregnancy test  Spoke with patient about positive urine pregnancy test on 02/03/2020.  I was out of the office when the test was performed.  The patient received IV iron (Venofer) that day.  Per nursing, patient was having her period and was scheduled for a hysterectomy.  The patient told me today that she has an IUD in place.  I discussed repeating the urine pregnancy test as well as serum pregnancy test this week.  The patient was in agreement.   Lequita Asal, MD

## 2020-02-07 ENCOUNTER — Other Ambulatory Visit: Payer: Self-pay

## 2020-02-07 ENCOUNTER — Inpatient Hospital Stay: Payer: Medicaid Other

## 2020-02-07 DIAGNOSIS — Z3201 Encounter for pregnancy test, result positive: Secondary | ICD-10-CM

## 2020-02-07 DIAGNOSIS — D500 Iron deficiency anemia secondary to blood loss (chronic): Secondary | ICD-10-CM | POA: Diagnosis not present

## 2020-02-07 LAB — HCG, QUANTITATIVE, PREGNANCY: hCG, Beta Chain, Quant, S: 7 m[IU]/mL — ABNORMAL HIGH (ref <5)

## 2020-02-07 LAB — PREGNANCY, URINE: Preg Test, Ur: NEGATIVE

## 2020-02-08 ENCOUNTER — Telehealth: Payer: Self-pay | Admitting: Obstetrics and Gynecology

## 2020-02-08 NOTE — Telephone Encounter (Signed)
Patient is returning missed call from Dr. Gilman Schmidt. Please advise

## 2020-02-08 NOTE — Telephone Encounter (Signed)
I have the surgery now in the depot. I will go ahead and have it placed back on the schedule. I apologize for my premature cancellation.  Sherri Fitzgerald - should I go ahead and reschedule her pre-admit phone and Covid appts?

## 2020-02-08 NOTE — Telephone Encounter (Signed)
Spoke with patient.

## 2020-02-08 NOTE — Telephone Encounter (Signed)
Is her surgery cancelled? I'm confused

## 2020-02-09 ENCOUNTER — Other Ambulatory Visit: Payer: Medicaid Other

## 2020-02-09 ENCOUNTER — Other Ambulatory Visit: Payer: Self-pay

## 2020-02-09 ENCOUNTER — Other Ambulatory Visit: Admission: RE | Admit: 2020-02-09 | Payer: Medicaid Other | Source: Ambulatory Visit

## 2020-02-09 ENCOUNTER — Ambulatory Visit: Payer: Medicaid Other | Admitting: Obstetrics and Gynecology

## 2020-02-09 ENCOUNTER — Other Ambulatory Visit: Payer: Self-pay | Admitting: Obstetrics and Gynecology

## 2020-02-09 DIAGNOSIS — Z3490 Encounter for supervision of normal pregnancy, unspecified, unspecified trimester: Secondary | ICD-10-CM

## 2020-02-10 LAB — BETA HCG QUANT (REF LAB): hCG Quant: 2 m[IU]/mL

## 2020-02-13 ENCOUNTER — Encounter
Admission: RE | Admit: 2020-02-13 | Discharge: 2020-02-13 | Disposition: A | Discharge status: Home / Self Care | Payer: Medicaid Other | Source: Ambulatory Visit | Attending: Obstetrics and Gynecology | Admitting: Obstetrics and Gynecology

## 2020-02-13 ENCOUNTER — Other Ambulatory Visit: Payer: Self-pay

## 2020-02-13 ENCOUNTER — Encounter: Payer: Self-pay | Admitting: *Deleted

## 2020-02-13 HISTORY — DX: Panic disorder (episodic paroxysmal anxiety): F41.0

## 2020-02-13 HISTORY — DX: Palpitations: R00.2

## 2020-02-13 NOTE — Pre-Procedure Instructions (Signed)
CARDIAC CLEARANCE ON CHART AND IN EPIC

## 2020-02-13 NOTE — Pre-Procedure Instructions (Signed)
Wellington Hampshire, MD  Physician  Cardiology     Progress Notes     Signed     Encounter Date:  01/17/2020                  Signed          Expand AllCollapse All            Expand widget buttonCollapse widget button    Show:Clear all   ManualTemplateCopied  Added by:     Wellington Hampshire, MD   Hover for detailscustomization button                                                                                                                     untitled image      Cardiology Office Note        Date:  01/17/2020      ID:  Nickeya Tak, DOB November 24, 1986, MRN [id number]     PCP:  Dionne Ano, CNM      Cardiologist:   Kathlyn Sacramento, MD           Chief Complaint    Patient presents with    .   New Patient (Initial Visit)            Ref by Dr. Gilman Schmidt for irreg. heart beats. Meds reviewed by the pt. verbally. Pt. c/o irreg. heart beats and symptoms of heart fluttering.              History of Present Illness:  Sherri Fitzgerald is a 34 y.o. female who was referred by Dr. Gilman Schmidt for evaluation of palpitations.  The patient reports strong family history of hypertrophic cardiomyopathy.  Her mother had it and also her maternal grandfather.  She was screened for it at the age of 34 with an EKG and echocardiogram and both were unremarkable.  She was told about a heart murmur at some point.  She is otherwise healthy.  She does have history of iron deficiency anemia due to menorrhagia.  She is not a smoker.  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  She reports almost daily palpitations described as skipping.  In addition, she had 2 episodes of brief tachycardia over the last 8 months.  She denies dizziness, syncope or presyncope.  She does have occasional short-lived chest discomfort but no  significant shortness of breath.                Past Medical History:    Diagnosis   Date    .   Anemia                    Past Surgical History:    Procedure   Laterality   Date    .   CESAREAN SECTION       2006    .   FLEXIBLE SIGMOIDOSCOPY   N/A   06/25/2018        Procedure: FLEXIBLE SIGMOIDOSCOPY;  Surgeon: Lin Landsman, MD;  Location: [facility] ENDOSCOPY;  Service: Gastroenterology;  Laterality: N/A;    .   HEMORROIDECTOMY       06/2018                    Current Outpatient Medications    Medication   Sig   Dispense   Refill    .   ferrous sulfate 325 (65 FE) MG EC tablet   Take 325 mg by mouth daily with breakfast.            .   Multiple Vitamins-Minerals (MULTIVITAMIN ADULT PO)   multivitamin            .   PARAGARD INTRAUTERINE COPPER IUD IUD   ParaGard T 380A 380 square mm intrauterine device   Take 1 device by intrauterine route.            .   Pyridoxine HCl (VITAMIN B-6) 500 MG tablet   Take 500 mg by mouth daily.                No current facility-administered medications for this visit.          Allergies:   Patient has no known allergies.         Social History:  The patient  reports that she has never smoked. She has never used smokeless tobacco. She reports current alcohol use. She reports that she does not use drugs.      Family History:  The patient's family history includes Cancer in her paternal grandmother; Heart attack in her mother and paternal grandfather; Heart disease in her mother and paternal grandfather; Heart failure in her paternal grandfather; Hypertension in her mother; Hypertrophic cardiomyopathy in her mother.         ROS:  Please see the history of present illness.   Otherwise, review of systems are positive for none.   All other systems are reviewed and negative.         PHYSICAL EXAM:  VS:  BP 120/72 (BP  Location: Right Arm, Patient Position: Sitting, Cuff Size: Normal)   Pulse 77   Ht 5\' 6"  (1.676 m)   Wt 157 lb (71.2 kg)   LMP 12/28/2019 (Exact Date)   SpO2 98%   BMI 25.34 kg/m  , BMI Body mass index is 25.34 kg/m.  GEN: Well nourished, well developed, in no acute distress   HEENT: normal   Neck: no JVD, carotid bruits, or masses  Cardiac: RRR; no  rubs, or gallops,no edema .  2 out of 6 systolic murmur in the pulmonic area which becomes softer with Valsalva maneuver  Respiratory:  clear to auscultation bilaterally, normal work of breathing  GI: soft, nontender, nondistended, + BS  MS: no deformity or atrophy   Skin: warm and dry, no rash  Neuro:  Strength and sensation are intact  Psych: euthymic mood, full affect        EKG:  EKG is ordered today.  The ekg ordered today demonstrates normal sinus rhythm with no significant ST or T wave changes.        Recent Labs:  01/10/2020: Hemoglobin 13.3; Platelets 368; TSH 0.830         Lipid Panel   Labs (Brief)                 Wt Readings from Last 3 Encounters:    01/17/20   157 lb (71.2 kg)    01/10/20  156 lb (70.8 kg)    11/10/18   150 lb 14.5 oz (68.5 kg)                    PAD Screen   01/17/2020    Previous PAD dx?   No    Previous surgical procedure?   No    Pain with walking?   No    Feet/toe relief with dangling?   No    Painful, non-healing ulcers?   No    Extremities discolored?   No                ASSESSMENT AND PLAN:     1.  Palpitations: Suspect premature beats.  I am going to obtain a 2-week outpatient monitor.     2.  Cardiac murmur: The murmur is not consistent with hypertrophic cardiomyopathy as it is softer with Valsalva maneuver.  I requested an echocardiogram especially that she has a strong family history of hypertrophic cardiomyopathy.           Disposition:   FU with me as needed.       Signed,     Kathlyn Sacramento, MD   01/17/2020 2:06 PM     [facility]          Electronically signed by Wellington Hampshire, MD at 01/17/2020  4:36 PM             Office Visit on 01/17/2020               Detailed Report             Note viewed by patient

## 2020-02-13 NOTE — Patient Instructions (Signed)
Your procedure is scheduled on: 02-16-20 THURSDAY Report to Same Day Surgery 2nd floor medical mall [facility] Entrance-take elevator on left to 2nd floor.  Check in with surgery information desk.) To find out your arrival time please call [phone] between 1PM - 3PM on 02-15-20 [facility]  Remember: Instructions that are not followed completely may result in serious medical risk, up to and including death, or upon the discretion of your surgeon and anesthesiologist your surgery may need to be rescheduled.    _x___ 1. Do not eat food after midnight the night before your procedure. NO GUM OR CANDY AFTER MIDNIGHT. You may drink clear liquids up to 2 hours before you are scheduled to arrive at the hospital for your procedure.  Do not drink clear liquids within 2 hours of your scheduled arrival to the hospital.  Clear liquids include  --Water or Apple juice without pulp  --Gatorade  --Black Coffee or Clear Tea (No milk, no creamers, do not add anything to the coffee or Tea  ____Ensure clear carbohydrate drink on the way to the hospital for bariatric patients  _X___Ensure clear carbohydrate drink-FINISH DRINK 2 HOURS PRIOR TO [place]    __x__ 2. No Alcohol for 24 hours before or after surgery.   __x__3. No Smoking or e-cigarettes for 24 prior to surgery.  Do not use any chewable tobacco products for at least 6 hour prior to surgery   ____  4. Bring all medications with you on the day of surgery if instructed.    __x__ 5. Notify your doctor if there is any change in your medical condition     (cold, fever, infections).    x___6. On the morning of surgery brush your teeth with toothpaste and water.  You may rinse your mouth with mouth wash if you wish.  Do not swallow any toothpaste or mouthwash.   Do not wear jewelry, make-up, hairpins, clips or nail polish.  Do not wear lotions, powders, or perfumes. You may wear deodorant.  Do not shave 48 hours prior to surgery.  Men may shave face and neck.  Do not bring valuables to the hospital.    [facility] is not responsible for any belongings or valuables.               Contacts, dentures or bridgework may not be worn into surgery.  Leave your suitcase in the car. After surgery it may be brought to your room.  For patients admitted to the hospital, discharge time is determined by your treatment team.  _  Patients discharged the day of surgery will not be allowed to drive home.  You will need someone to drive you home and stay with you the night of your procedure.    Please read over the following fact sheets that you were given:   [facility] Preparing for Surgery   ____ Take anti-hypertensive listed below, cardiac, seizure, asthma, anti-reflux and psychiatric medicines. These include:  1. NONE  2.  3.  4.  5.  6.  ____Fleets enema or Magnesium Citrate as directed.   ____ Use CHG Soap or sage wipes as directed on instruction sheet   ____ Use inhalers on the day of surgery and bring to hospital day of surgery  ____ Stop Metformin and Janumet 2 days prior to surgery.    ____ Take 1/2 of usual insulin dose the night before surgery and none on the morning  surgery.   ____ Follow recommendations from Cardiologist,  Pulmonologist or PCP regarding stopping Aspirin, Coumadin, Plavix ,Eliquis, Effient, or Pradaxa, and Pletal.  X____Stop Anti-inflammatories such as Advil, Aleve, Ibuprofen, Motrin, Naproxen, Naprosyn, Goodies powders or aspirin products NOW-OK to take Tylenol    ____ Stop supplements until after surgery.     ____ Bring C-Pap to the hospital.

## 2020-02-14 ENCOUNTER — Other Ambulatory Visit
Admission: RE | Admit: 2020-02-14 | Discharge: 2020-02-14 | Disposition: A | Discharge status: Home / Self Care | Payer: Medicaid Other | Source: Ambulatory Visit | Attending: Obstetrics and Gynecology | Admitting: Obstetrics and Gynecology

## 2020-02-14 DIAGNOSIS — Z01812 Encounter for preprocedural laboratory examination: Secondary | ICD-10-CM | POA: Insufficient documentation

## 2020-02-14 DIAGNOSIS — Z20822 Contact with and (suspected) exposure to covid-19: Secondary | ICD-10-CM | POA: Diagnosis not present

## 2020-02-14 LAB — CBC
HCT: 41.3 % (ref 36.0–46.0)
Hemoglobin: 13.1 g/dL (ref 12.0–15.0)
MCH: 28.2 pg (ref 26.0–34.0)
MCHC: 31.7 g/dL (ref 30.0–36.0)
MCV: 88.8 fL (ref 80.0–100.0)
Platelets: 303 10*3/uL (ref 150–400)
RBC: 4.65 MIL/uL (ref 3.87–5.11)
RDW: 14.5 % (ref 11.5–15.5)
WBC: 5.9 10*3/uL (ref 4.0–10.5)
nRBC: 0 % (ref 0.0–0.2)

## 2020-02-14 LAB — SARS CORONAVIRUS 2 (TAT 6-24 HRS): SARS Coronavirus 2: NEGATIVE

## 2020-02-16 ENCOUNTER — Encounter: Payer: Self-pay | Admitting: Obstetrics and Gynecology

## 2020-02-16 ENCOUNTER — Encounter
Admission: RE | Disposition: A | Discharge status: Home / Self Care | Payer: Self-pay | Source: Home / Self Care | Attending: Obstetrics and Gynecology

## 2020-02-16 ENCOUNTER — Inpatient Hospital Stay: Payer: Medicaid Other | Admitting: Anesthesiology

## 2020-02-16 ENCOUNTER — Other Ambulatory Visit: Payer: Self-pay

## 2020-02-16 ENCOUNTER — Inpatient Hospital Stay
Admission: RE | Admit: 2020-02-16 | Payer: Medicaid Other | Source: Home / Self Care | Admitting: Obstetrics and Gynecology

## 2020-02-16 ENCOUNTER — Encounter: Admission: RE | Payer: Self-pay | Source: Home / Self Care

## 2020-02-16 ENCOUNTER — Inpatient Hospital Stay
Admission: RE | Admit: 2020-02-16 | Discharge: 2020-02-17 | DRG: 743 | Disposition: A | Discharge status: Home / Self Care | Payer: Medicaid Other | Attending: Obstetrics and Gynecology | Admitting: Obstetrics and Gynecology

## 2020-02-16 DIAGNOSIS — Z30432 Encounter for removal of intrauterine contraceptive device: Secondary | ICD-10-CM

## 2020-02-16 DIAGNOSIS — N72 Inflammatory disease of cervix uteri: Secondary | ICD-10-CM | POA: Diagnosis not present

## 2020-02-16 DIAGNOSIS — N939 Abnormal uterine and vaginal bleeding, unspecified: Secondary | ICD-10-CM

## 2020-02-16 DIAGNOSIS — G2581 Restless legs syndrome: Secondary | ICD-10-CM | POA: Diagnosis present

## 2020-02-16 DIAGNOSIS — D251 Intramural leiomyoma of uterus: Secondary | ICD-10-CM

## 2020-02-16 DIAGNOSIS — N920 Excessive and frequent menstruation with regular cycle: Principal | ICD-10-CM | POA: Diagnosis present

## 2020-02-16 DIAGNOSIS — N938 Other specified abnormal uterine and vaginal bleeding: Secondary | ICD-10-CM

## 2020-02-16 DIAGNOSIS — Z90710 Acquired absence of both cervix and uterus: Secondary | ICD-10-CM | POA: Diagnosis present

## 2020-02-16 HISTORY — PX: VAGINAL HYSTERECTOMY: SHX2639

## 2020-02-16 LAB — ABO/RH: ABO/RH(D): A POS

## 2020-02-16 LAB — TYPE AND SCREEN
ABO/RH(D): A POS
Antibody Screen: NEGATIVE

## 2020-02-16 LAB — POCT PREGNANCY, URINE: Preg Test, Ur: NEGATIVE

## 2020-02-16 SURGERY — HYSTERECTOMY, VAGINAL
Anesthesia: General

## 2020-02-16 MED ORDER — FERROUS SULFATE 325 (65 FE) MG PO TABS
650.0000 mg | ORAL_TABLET | Freq: Every day | ORAL | Status: DC
  Administered 2020-02-17: 10:00:00 650 mg via ORAL
  Filled 2020-02-16: qty 2

## 2020-02-16 MED ORDER — EPHEDRINE SULFATE 50 MG/ML IJ SOLN
INTRAMUSCULAR | Status: DC | PRN
  Administered 2020-02-16 (×5): 5 mg via INTRAVENOUS

## 2020-02-16 MED ORDER — LACTATED RINGERS IV SOLN: INTRAVENOUS | Status: DC

## 2020-02-16 MED ORDER — DEXAMETHASONE SODIUM PHOSPHATE 10 MG/ML IJ SOLN
INTRAMUSCULAR | Status: AC
  Filled 2020-02-16: qty 1

## 2020-02-16 MED ORDER — MIDAZOLAM HCL 2 MG/2ML IJ SOLN
INTRAMUSCULAR | Status: DC | PRN
  Administered 2020-02-16: 2 mg via INTRAVENOUS

## 2020-02-16 MED ORDER — PROPOFOL 10 MG/ML IV BOLUS
INTRAVENOUS | Status: DC | PRN
  Administered 2020-02-16: 180 mg via INTRAVENOUS

## 2020-02-16 MED ORDER — SUGAMMADEX SODIUM 200 MG/2ML IV SOLN
INTRAVENOUS | Status: DC | PRN
  Administered 2020-02-16: 200 mg via INTRAVENOUS

## 2020-02-16 MED ORDER — LIDOCAINE HCL (CARDIAC) PF 100 MG/5ML IV SOSY
PREFILLED_SYRINGE | INTRAVENOUS | Status: DC | PRN
  Administered 2020-02-16: 60 mg via INTRAVENOUS

## 2020-02-16 MED ORDER — ACETAMINOPHEN 10 MG/ML IV SOLN
INTRAVENOUS | Status: DC | PRN
  Administered 2020-02-16: 1000 mg via INTRAVENOUS

## 2020-02-16 MED ORDER — ESTROGENS, CONJUGATED 0.625 MG/GM VA CREA
TOPICAL_CREAM | VAGINAL | Status: DC | PRN
  Administered 2020-02-16: 1 via VAGINAL

## 2020-02-16 MED ORDER — POLYETHYLENE GLYCOL 3350 17 G PO PACK
17.0000 g | PACK | Freq: Every day | ORAL | Status: DC | PRN
  Filled 2020-02-16: qty 1

## 2020-02-16 MED ORDER — CEFAZOLIN SODIUM-DEXTROSE 2-4 GM/100ML-% IV SOLN
INTRAVENOUS | Status: AC
  Filled 2020-02-16: qty 100

## 2020-02-16 MED ORDER — MIDAZOLAM HCL 2 MG/2ML IJ SOLN
INTRAMUSCULAR | Status: AC
  Filled 2020-02-16: qty 2

## 2020-02-16 MED ORDER — ACETAMINOPHEN NICU IV SYRINGE 10 MG/ML
INTRAVENOUS | Status: AC
  Filled 2020-02-16: qty 1

## 2020-02-16 MED ORDER — IBUPROFEN 600 MG PO TABS
600.0000 mg | ORAL_TABLET | Freq: Four times a day (QID) | ORAL | Status: DC
  Administered 2020-02-16 – 2020-02-17 (×4): 600 mg via ORAL
  Filled 2020-02-16 (×4): qty 1

## 2020-02-16 MED ORDER — ADULT MULTIVITAMIN W/MINERALS CH
1.0000 | ORAL_TABLET | Freq: Every day | ORAL | Status: DC
  Administered 2020-02-16: 20:00:00 1 via ORAL
  Filled 2020-02-16 (×2): qty 1

## 2020-02-16 MED ORDER — LIDOCAINE-EPINEPHRINE (PF) 1 %-1:200000 IJ SOLN
INTRAMUSCULAR | Status: DC | PRN
  Administered 2020-02-16: 5 mL

## 2020-02-16 MED ORDER — OXYCODONE HCL 5 MG PO TABS: 5.0000 mg | ORAL_TABLET | Freq: Once | ORAL | Status: DC | PRN

## 2020-02-16 MED ORDER — VITAMIN B-6 100 MG PO TABS
500.0000 mg | ORAL_TABLET | Freq: Every day | ORAL | Status: DC
  Administered 2020-02-16: 500 mg via ORAL
  Filled 2020-02-16: qty 10
  Filled 2020-02-16 (×2): qty 5

## 2020-02-16 MED ORDER — DOCUSATE SODIUM 100 MG PO CAPS
100.0000 mg | ORAL_CAPSULE | Freq: Two times a day (BID) | ORAL | Status: DC
  Administered 2020-02-17: 10:00:00 100 mg via ORAL
  Filled 2020-02-16: qty 1

## 2020-02-16 MED ORDER — BISACODYL 10 MG RE SUPP: 10.0000 mg | Freq: Every day | RECTAL | Status: DC | PRN

## 2020-02-16 MED ORDER — FENTANYL CITRATE (PF) 100 MCG/2ML IJ SOLN
INTRAMUSCULAR | Status: DC | PRN
  Administered 2020-02-16: 50 ug via INTRAVENOUS
  Administered 2020-02-16 (×2): 25 ug via INTRAVENOUS

## 2020-02-16 MED ORDER — ACETAMINOPHEN 500 MG PO TABS
1000.0000 mg | ORAL_TABLET | Freq: Four times a day (QID) | ORAL | Status: DC
  Administered 2020-02-16 – 2020-02-17 (×2): 1000 mg via ORAL
  Filled 2020-02-16 (×2): qty 2

## 2020-02-16 MED ORDER — ROCURONIUM BROMIDE 10 MG/ML (PF) SYRINGE
PREFILLED_SYRINGE | INTRAVENOUS | Status: AC
  Filled 2020-02-16: qty 10

## 2020-02-16 MED ORDER — FENTANYL CITRATE (PF) 100 MCG/2ML IJ SOLN
INTRAMUSCULAR | Status: AC
  Filled 2020-02-16: qty 2

## 2020-02-16 MED ORDER — SIMETHICONE 80 MG PO CHEW
80.0000 mg | CHEWABLE_TABLET | Freq: Four times a day (QID) | ORAL | Status: DC
  Administered 2020-02-16 – 2020-02-17 (×2): 80 mg via ORAL
  Filled 2020-02-16 (×2): qty 1

## 2020-02-16 MED ORDER — ONDANSETRON HCL 4 MG/2ML IJ SOLN
INTRAMUSCULAR | Status: DC | PRN
  Administered 2020-02-16: 4 mg via INTRAVENOUS

## 2020-02-16 MED ORDER — LIDOCAINE-EPINEPHRINE (PF) 1 %-1:200000 IJ SOLN
INTRAMUSCULAR | Status: AC
  Filled 2020-02-16: qty 30

## 2020-02-16 MED ORDER — FENTANYL CITRATE (PF) 100 MCG/2ML IJ SOLN
INTRAMUSCULAR | Status: AC
  Administered 2020-02-16: 25 ug via INTRAVENOUS
  Filled 2020-02-16: qty 2

## 2020-02-16 MED ORDER — ONDANSETRON HCL 4 MG PO TABS: 4.0000 mg | ORAL_TABLET | Freq: Four times a day (QID) | ORAL | Status: DC | PRN

## 2020-02-16 MED ORDER — FAMOTIDINE 20 MG PO TABS
ORAL_TABLET | ORAL | Status: AC
  Filled 2020-02-16: qty 1

## 2020-02-16 MED ORDER — CEFAZOLIN SODIUM-DEXTROSE 2-4 GM/100ML-% IV SOLN
2.0000 g | INTRAVENOUS | Status: AC
  Administered 2020-02-16: 2 g via INTRAVENOUS

## 2020-02-16 MED ORDER — PROMETHAZINE HCL 25 MG/ML IJ SOLN: 6.2500 mg | INTRAMUSCULAR | Status: DC | PRN

## 2020-02-16 MED ORDER — OXYCODONE HCL 5 MG PO TABS
5.0000 mg | ORAL_TABLET | ORAL | Status: DC | PRN
  Administered 2020-02-16 – 2020-02-17 (×3): 10 mg via ORAL
  Filled 2020-02-16 (×4): qty 2

## 2020-02-16 MED ORDER — ONDANSETRON HCL 4 MG/2ML IJ SOLN
INTRAMUSCULAR | Status: AC
  Filled 2020-02-16: qty 2

## 2020-02-16 MED ORDER — ROCURONIUM BROMIDE 100 MG/10ML IV SOLN
INTRAVENOUS | Status: DC | PRN
  Administered 2020-02-16: 40 mg via INTRAVENOUS
  Administered 2020-02-16 (×3): 10 mg via INTRAVENOUS

## 2020-02-16 MED ORDER — LIDOCAINE HCL (PF) 2 % IJ SOLN
INTRAMUSCULAR | Status: AC
  Filled 2020-02-16: qty 5

## 2020-02-16 MED ORDER — OXYCODONE HCL 5 MG/5ML PO SOLN: 5.0000 mg | Freq: Once | ORAL | Status: DC | PRN

## 2020-02-16 MED ORDER — DEXAMETHASONE SODIUM PHOSPHATE 10 MG/ML IJ SOLN
INTRAMUSCULAR | Status: DC | PRN
  Administered 2020-02-16: 10 mg via INTRAVENOUS

## 2020-02-16 MED ORDER — PROPOFOL 10 MG/ML IV BOLUS
INTRAVENOUS | Status: AC
  Filled 2020-02-16: qty 20

## 2020-02-16 MED ORDER — EPHEDRINE 5 MG/ML INJ
INTRAVENOUS | Status: AC
  Filled 2020-02-16: qty 10

## 2020-02-16 MED ORDER — PHENYLEPHRINE HCL (PRESSORS) 10 MG/ML IV SOLN
INTRAVENOUS | Status: DC | PRN
  Administered 2020-02-16 (×4): 100 ug via INTRAVENOUS
  Administered 2020-02-16: 50 ug via INTRAVENOUS
  Administered 2020-02-16 (×2): 100 ug via INTRAVENOUS
  Administered 2020-02-16: 50 ug via INTRAVENOUS
  Administered 2020-02-16: 100 ug via INTRAVENOUS

## 2020-02-16 MED ORDER — FENTANYL CITRATE (PF) 100 MCG/2ML IJ SOLN
25.0000 ug | INTRAMUSCULAR | Status: DC | PRN
  Administered 2020-02-16: 11:00:00 25 ug via INTRAVENOUS

## 2020-02-16 MED ORDER — FAMOTIDINE 20 MG PO TABS
20.0000 mg | ORAL_TABLET | Freq: Once | ORAL | Status: AC
  Administered 2020-02-16: 07:00:00 20 mg via ORAL

## 2020-02-16 MED ORDER — ONDANSETRON HCL 4 MG/2ML IJ SOLN
4.0000 mg | Freq: Four times a day (QID) | INTRAMUSCULAR | Status: DC | PRN
  Administered 2020-02-16 – 2020-02-17 (×2): 4 mg via INTRAVENOUS
  Filled 2020-02-16 (×3): qty 2

## 2020-02-16 SURGICAL SUPPLY — 33 items
BAG URINE DRAIN 2000ML AR STRL (UROLOGICAL SUPPLIES) ×3 IMPLANT
CANISTER SUCT 1200ML W/VALVE (MISCELLANEOUS) ×3 IMPLANT
CATH FOLEY 2WAY  5CC 16FR (CATHETERS) ×2
CATH URTH 16FR FL 2W BLN LF (CATHETERS) ×1 IMPLANT
DRAPE 3/4 80X56 (DRAPES) ×3 IMPLANT
DRAPE PERI LITHO V/GYN (MISCELLANEOUS) ×3 IMPLANT
DRAPE UNDER BUTTOCK W/FLU (DRAPES) ×3 IMPLANT
ELECT CAUTERY BLADE 6.4 (BLADE) ×3 IMPLANT
ELECT REM PT RETURN 9FT ADLT (ELECTROSURGICAL) ×3
ELECTRODE REM PT RTRN 9FT ADLT (ELECTROSURGICAL) ×1 IMPLANT
GAUZE PACK 2X3YD (GAUZE/BANDAGES/DRESSINGS) ×3 IMPLANT
GLOVE BIOGEL PI IND STRL 6.5 (GLOVE) ×1 IMPLANT
GLOVE BIOGEL PI INDICATOR 6.5 (GLOVE) ×2
GLOVE SURG SYN 6.5 ES PF (GLOVE) ×6 IMPLANT
GLOVE SURG SYN 6.5 PF PI (GLOVE) ×2 IMPLANT
GOWN STRL REUS W/ TWL XL LVL3 (GOWN DISPOSABLE) ×1 IMPLANT
GOWN STRL REUS W/TWL LRG LVL3 (GOWN DISPOSABLE) ×9 IMPLANT
GOWN STRL REUS W/TWL XL LVL3 (GOWN DISPOSABLE) ×2
LABEL OR SOLS (LABEL) ×3 IMPLANT
PACK BASIN MINOR ARMC (MISCELLANEOUS) ×3 IMPLANT
PAD OB MATERNITY 4.3X12.25 (PERSONAL CARE ITEMS) ×3 IMPLANT
PAD PREP 24X41 OB/GYN DISP (PERSONAL CARE ITEMS) ×3 IMPLANT
SET CYSTO W/LG BORE CLAMP LF (SET/KITS/TRAYS/PACK) ×3 IMPLANT
SPONGE LAP 4X18 RFD (DISPOSABLE) ×2 IMPLANT
SUCTION FRAZIER HANDLE 10FR (MISCELLANEOUS) ×2
SUCTION TUBE FRAZIER 10FR DISP (MISCELLANEOUS) IMPLANT
SUT VIC AB 0 CT1 27 (SUTURE) ×6
SUT VIC AB 0 CT1 27XCR 8 STRN (SUTURE) ×3 IMPLANT
SUT VIC AB 0 CT1 36 (SUTURE) ×3 IMPLANT
SUT VIC AB 1 CT1 36 (SUTURE) ×6 IMPLANT
SUT VIC AB 2-0 CT1 (SUTURE) ×6 IMPLANT
SYR 10ML LL (SYRINGE) ×3 IMPLANT
SYR CONTROL 10ML LL (SYRINGE) ×2 IMPLANT

## 2020-02-16 NOTE — Progress Notes (Signed)
Foley and packing gauze removed.   Adrian Prows MD [facility] [place] 02/16/2020 6:15 PM

## 2020-02-16 NOTE — Transfer of Care (Signed)
Immediate Anesthesia Transfer of Care Note  Patient: Sherri Fitzgerald  Procedure(s) Performed: HYSTERECTOMY VAGINAL WITH SALPINGECTOMY (N/A )  Patient Location: PACU  Anesthesia Type:General  Level of Consciousness: drowsy  Airway & Oxygen Therapy: Patient Spontanous Breathing and Patient connected to face mask oxygen  Post-op Assessment: Report given to RN and Post -op Vital signs reviewed and stable  Post vital signs: Reviewed and stable  Last Vitals:  Vitals Value Taken Time  BP 103/58 02/16/20 1009  Temp 37.1 C 02/16/20 1009  Pulse 81 02/16/20 1013  Resp 16 02/16/20 1013  SpO2 100 % 02/16/20 1013  Vitals shown include unvalidated device data.  Last Pain:  Vitals:   02/16/20 0608  TempSrc: Oral  PainSc: 0-No pain         Complications: No apparent anesthesia complications

## 2020-02-16 NOTE — Anesthesia Procedure Notes (Signed)
Procedure Name: Intubation Date/Time: 02/16/2020 7:31 AM Performed by: Caryl Asp, CRNA Pre-anesthesia Checklist: Patient identified, Patient being monitored, Timeout performed, Emergency Drugs available and Suction available Patient Re-evaluated:Patient Re-evaluated prior to induction Oxygen Delivery Method: Circle system utilized Preoxygenation: Pre-oxygenation with 100% oxygen Induction Type: IV induction Ventilation: Mask ventilation without difficulty Laryngoscope Size: 3 and McGraph Grade View: Grade I Tube type: Oral Tube size: 7.0 mm Number of attempts: 1 Airway Equipment and Method: Stylet and Video-laryngoscopy Placement Confirmation: ETT inserted through vocal cords under direct vision,  positive ETCO2 and breath sounds checked- equal and bilateral Secured at: 22 cm Tube secured with: Tape Dental Injury: Teeth and Oropharynx as per pre-operative assessment

## 2020-02-16 NOTE — Anesthesia Preprocedure Evaluation (Signed)
Anesthesia Evaluation  Patient identified by MRN, date of birth, ID band Patient awake    Reviewed: Allergy & Precautions, H&P , NPO status , Patient's Chart, lab work & pertinent test results  Airway Mallampati: I  TM Distance: >3 FB Neck ROM: full    Dental  (+) Chipped   Pulmonary neg pulmonary ROS, neg shortness of breath,           Cardiovascular Exercise Tolerance: Good (-) angina(-) Past MI and (-) DOE negative cardio ROS       Neuro/Psych PSYCHIATRIC DISORDERS negative neurological ROS     GI/Hepatic negative GI ROS, Neg liver ROS, neg GERD  ,  Endo/Other  negative endocrine ROS  Renal/GU      Musculoskeletal   Abdominal   Peds  Hematology negative hematology ROS (+)   Anesthesia Other Findings Past Medical History: No date: Anemia No date: Palpitations No date: Panic attacks  Past Surgical History: 2006: CESAREAN SECTION 06/25/2018: FLEXIBLE SIGMOIDOSCOPY; N/A     Comment:  Procedure: FLEXIBLE SIGMOIDOSCOPY;  Surgeon: Lin Landsman, MD;  Location: [facility] ENDOSCOPY;  Service:               Gastroenterology;  Laterality: N/A; 06/2018: HEMORROIDECTOMY No date: WISDOM TOOTH EXTRACTION  BMI    Body Mass Index: 25.02 kg/m      Reproductive/Obstetrics negative OB ROS                             Anesthesia Physical Anesthesia Plan  ASA: II  Anesthesia Plan: General ETT   Post-op Pain Management:    Induction: Intravenous  PONV Risk Score and Plan: Ondansetron, Dexamethasone, Midazolam and Treatment may vary due to age or medical condition  Airway Management Planned: Oral ETT  Additional Equipment:   Intra-op Plan:   Post-operative Plan: Extubation in OR  Informed Consent: I have reviewed the patients History and Physical, chart, labs and discussed the procedure including the risks, benefits and alternatives for the proposed anesthesia with the  patient or authorized representative who has indicated his/her understanding and acceptance.     Dental Advisory Given  Plan Discussed with: Anesthesiologist, CRNA and Surgeon  Anesthesia Plan Comments: (Patient consented for risks of anesthesia including but not limited to:  - adverse reactions to medications - damage to teeth, lips or other oral mucosa - sore throat or hoarseness - Damage to heart, brain, lungs or loss of life  Patient voiced understanding.)        Anesthesia Quick Evaluation

## 2020-02-16 NOTE — Interval H&P Note (Signed)
History and Physical Interval Note:  02/16/2020 7:19 AM  Sherri Fitzgerald  has presented today for surgery, with the diagnosis of N92.0.  The various methods of treatment have been discussed with the patient and family. After consideration of risks, benefits and other options for treatment, the patient has consented to  Procedure(s): HYSTERECTOMY VAGINAL WITH SALPINGECTOMY (N/A) as a surgical intervention.  The patient's history has been reviewed, patient examined, no change in status, stable for surgery.  I have reviewed the patient's chart and labs.  Questions were answered to the patient's satisfaction.     [place]

## 2020-02-16 NOTE — Op Note (Addendum)
Preoperative diagnosis: Menorrhagia, Abnormal uterine leeding  Postoperative diagnosis: Same  Procedure: Transvaginal hysterectomy, Bilateral Salpingectomy, IUD removal  Surgeon: Adrian Prows M.D.  Assistant: Malachy Mood M.D.  Anesthesia: general  Findings: Normal uterus fallopian tubes and ovaries.  Estimated blood loss: 200 cc  Specimen: Uterus, fallopian tubes, and IUD to pathology   Disposition: Tolerated procedure well  Procedure: Patient was taken to the OR where she was placed in dorsal lithotomy in [place]. She was prepped and draped in the usual sterile fashion. A timeout was performed. Foley is placed into bladder. A speculum was placed inside the vagina. The cervix was visualized. The IUD strings were grasped and removed. The IUD was noted to be intact.1% lidocaine with epinephrine were injected paracervically. A bovie was used to make a circumferential incision around the vagina. An opened sponge was used to dissect the vagina off the cervix. The posterior peritoneum was entered sharply with Metzenbaum scissors.  The anterior peritoneal cavity was entered sharply with careful dissection of the bladder off the underlying cervix. A Heaney clamp was used to clamp first the left uterosacral ligament and cardinal which was then cut and Haney suture ligated with 0 Vicryl stitch, the stitch was held and later attached to the vaginal mucosa. Similarly the right uterosacral ligament was clamped cut and suture ligated.  Sequential clamping, transecting and suture ligating up the broad to the uterine arteries were taken until the tubo-ovarian pedicles were encountered.  The left utero-ovarian pedicle grasped with a Heaney clamp and the pedicle was suture ligated.The left fallopian tube elevated with a Babcock clamp and then was grasped with a Heaney clamp.  Mayo scissors were used to remove the fallopian tube and the pedicle was secured with 0 Vicryl suture.  This procedure was  then repeated on the right side.  The uterus was amputated and passed off the field. The fallopian tubes were passed off of the field. Inspection of all pedicles revealed adequate hemostasis.  The peritoneum was then closed with a running pursestring suture of 0 Vicryl. The  vaginal incision is closed with a running 0-Vicryl suture, with care taken to incorporate the uterosacral pedicles. Excellent hemostasis was noted at the end of the case. The vaginal cuff was inspected there was minimal bleeding noted.  Packing gauze w Premarin cream is placed. A Foley catheter is left in  place inside her bladder. Clear, yellow urine was noted. All instrument needle and lap counts were correct x 2. Patient was awakened taken to recovery room in stable condition.  Dr. Georgianne Fick was a skilled assistant in the case requiring high-level assistant.  He participated in all aspects of the case including assistance retraction of tissue, placement of clamps, and suction.   Homero Fellers, MD 02/16/2020, 10:11 AM

## 2020-02-17 LAB — CBC
HCT: 33 % — ABNORMAL LOW (ref 36.0–46.0)
Hemoglobin: 10.7 g/dL — ABNORMAL LOW (ref 12.0–15.0)
MCH: 28.3 pg (ref 26.0–34.0)
MCHC: 32.4 g/dL (ref 30.0–36.0)
MCV: 87.3 fL (ref 80.0–100.0)
Platelets: 263 10*3/uL (ref 150–400)
RBC: 3.78 MIL/uL — ABNORMAL LOW (ref 3.87–5.11)
RDW: 14.6 % (ref 11.5–15.5)
WBC: 10.9 10*3/uL — ABNORMAL HIGH (ref 4.0–10.5)
nRBC: 0 % (ref 0.0–0.2)

## 2020-02-17 LAB — SURGICAL PATHOLOGY

## 2020-02-17 MED ORDER — DOCUSATE SODIUM 100 MG PO CAPS: 100.0000 mg | ORAL_CAPSULE | Freq: Two times a day (BID) | ORAL | 1 refills | Status: DC

## 2020-02-17 MED ORDER — TRAMADOL HCL 50 MG PO TABS: 50.0000 mg | ORAL_TABLET | Freq: Four times a day (QID) | ORAL | 0 refills | Status: DC | PRN

## 2020-02-17 MED ORDER — IBUPROFEN 600 MG PO TABS: 600.0000 mg | ORAL_TABLET | Freq: Four times a day (QID) | ORAL | 1 refills | Status: DC | PRN

## 2020-02-17 MED ORDER — ONDANSETRON HCL 4 MG PO TABS: 4.0000 mg | ORAL_TABLET | Freq: Four times a day (QID) | ORAL | 1 refills | Status: DC | PRN

## 2020-02-17 MED ORDER — ACETAMINOPHEN 500 MG PO TABS: 1000.0000 mg | ORAL_TABLET | Freq: Four times a day (QID) | ORAL | 0 refills | Status: DC

## 2020-02-17 NOTE — Progress Notes (Signed)
Pt feeling better this morning. Pain controlled by PO pain medications and tolerated regular diet for breakfast. Planning for discharge today. Pt aware of current plan of care.

## 2020-02-17 NOTE — Progress Notes (Signed)
  Subjective:  Pain control is controlled. Voiding without difficulty. Tolerating a regular diet. Ambulating well. No concerns, feeling well.  Objective:   Blood pressure 103/67, pulse 67, temperature 98.4 F (36.9 C), temperature source Oral, resp. rate 18, height 5\' 6"  (1.676 m), weight 70.3 kg, last menstrual period 01/23/2020, SpO2 98 %.  General: NAD Pulmonary: no increased work of breathing Abdomen: non-distended, non-tender Extremities: no edema, no erythema, no tenderness, no signs of DVT  Results for orders placed or performed during the hospital encounter of 02/16/20 (from the past 72 hour(s))  Pregnancy, urine POC     Status: None   Collection Time: 02/16/20  6:20 AM  Result Value Ref Range   Preg Test, Ur NEGATIVE NEGATIVE    Comment:        THE SENSITIVITY OF THIS METHODOLOGY IS >24 mIU/mL   ABO/Rh     Status: None   Collection Time: 02/16/20  6:47 AM  Result Value Ref Range   ABO/RH(D)      A POS Performed at [facility], [address] [place], [place] [address]   Type and screen [place]     Status: None   Collection Time: 02/16/20  6:51 AM  Result Value Ref Range   ABO/RH(D) A POS    Antibody Screen NEG    Sample Expiration      02/19/2020,2359 Performed at [facility], [address] [place], [place] [address]   CBC     Status: Abnormal   Collection Time: 02/17/20  5:40 AM  Result Value Ref Range   WBC 10.9 (H) 4.0 - 10.5 K/uL   RBC 3.78 (L) 3.87 - 5.11 MIL/uL   Hemoglobin 10.7 (L) 12.0 - 15.0 g/dL   HCT 33.0 (L) 36.0 - 46.0 %   MCV 87.3 80.0 - 100.0 fL   MCH 28.3 26.0 - 34.0 pg   MCHC 32.4 30.0 - 36.0 g/dL   RDW 14.6 11.5 - 15.5 %   Platelets 263 150 - 400 K/uL   nRBC 0.0 0.0 - 0.2 %    Comment: Performed at [facility], [place],  [address]    Assessment:   34 y.o. [phone] postoperative day #1 from vaginal hysterectomy  Plan:    1) Regular diet  2) PO pain  medications- continue motrin and tylenol, switch to  3) Discussed colace daily and miralax PRN bowel movements 4) Encouraged ambulation 5) Disposition- planning discharge home today  [place], [place] 02/17/2020 7:50 AM

## 2020-02-17 NOTE — Discharge Summary (Signed)
Physician Discharge Summary   Patient ID: Sherri Fitzgerald [id number] 34 y.o. 1986/10/16  Admit date: 02/16/2020  Discharge date and time: No discharge date for patient encounter.   Admitting Physician: Homero Fellers, MD   Discharge Physician: Adrian Prows MD  Admission Diagnoses: S/P vaginal hysterectomy [Z90.710]  Discharge Diagnoses: s/p hysterectomy  Admission Condition: good  Discharged Condition: good  Indication for Admission: postoperative care  Hospital Course: Patient had an uncomplicated postoperative course. Her pain and nausea were controlled. She was able to urinate, ambulate and tolerate a normal diet at the time of her discharge home.   Consults: None  Significant Diagnostic Studies: labs: see Epic record  Treatments: IV hydration  Discharge Exam: BP 94/65 (BP Location: Left Arm)   Pulse 70   Temp 98.1 F (36.7 C) (Oral)   Resp 18   Ht 5\' 6"  (1.676 m)   Wt 70.3 kg   LMP 01/23/2020 (Exact Date)   SpO2 99%   BMI 25.02 kg/m   General Appearance:    Alert, cooperative, no distress, appears stated age  Head:    Normocephalic, without obvious abnormality, atraumatic  Eyes:    PERRL, conjunctiva/corneas clear, EOM's intact, fundi    benign, both eyes  Ears:    Normal TM's and external ear canals, both ears  Nose:   Nares normal, septum midline, mucosa normal, no drainage    or sinus tenderness  Throat:   Lips, mucosa, and tongue normal; teeth and gums normal  Neck:   Supple, symmetrical, trachea midline, no adenopathy;    thyroid:  no enlargement/tenderness/nodules; no carotid   bruit or JVD  Back:     Symmetric, no curvature, ROM normal, no CVA tenderness  Lungs:     Clear to auscultation bilaterally, respirations unlabored  Chest Wall:    No tenderness or deformity   Heart:    Regular rate and rhythm, S1 and S2 normal, no murmur, rub   or gallop  Breast Exam:    No tenderness, masses, or nipple abnormality  Abdomen:     Soft,  non-tender, bowel sounds active all four quadrants,    no masses, no organomegaly  Genitalia:    Normal female without lesion, discharge or tenderness  Rectal:    Normal tone, normal prostate, no masses or tenderness;   guaiac negative stool  Extremities:   Extremities normal, atraumatic, no cyanosis or edema  Pulses:   2+ and symmetric all extremities  Skin:   Skin color, texture, turgor normal, no rashes or lesions  Lymph nodes:   Cervical, supraclavicular, and axillary nodes normal  Neurologic:   CNII-XII intact, normal strength, sensation and reflexes    throughout    Disposition: Discharge disposition: 01-Home or Self Care       Patient Instructions:  Allergies as of 02/17/2020      Reactions   Morphine Itching, Nausea And Vomiting      Medication List    STOP taking these medications   paragard intrauterine copper Iud IUD     TAKE these medications   acetaminophen 500 MG tablet Commonly known as: TYLENOL Take 2 tablets (1,000 mg total) by mouth every 6 (six) hours.   docusate sodium 100 MG capsule Commonly known as: COLACE Take 1 capsule (100 mg total) by mouth 2 (two) times daily.   ferrous sulfate 325 (65 FE) MG EC tablet Take 650 mg by mouth daily with breakfast.   ibuprofen 600 MG tablet Commonly known as: ADVIL Take 1 tablet (600  mg total) by mouth every 6 (six) hours as needed.   multivitamin with minerals Tabs tablet Take 1 tablet by mouth daily.   ondansetron 4 MG tablet Commonly known as: ZOFRAN Take 1 tablet (4 mg total) by mouth every 6 (six) hours as needed for nausea.   traMADol 50 MG tablet Commonly known as: Ultram Take 1 tablet (50 mg total) by mouth every 6 (six) hours as needed.   vitamin B-6 500 MG tablet Take 500 mg by mouth daily.      Activity: activity as tolerated Diet: regular diet Wound Care: none needed  Follow-up with Dr. Gilman Schmidt in 1 week.  Signed: Homero Fellers 02/17/2020 11:10 AM

## 2020-02-17 NOTE — Progress Notes (Signed)
Patient discharged home with husband Discharge instructions and prescriptions given and reviewed with patient. Patient verbalized understanding. Escorted out by auxillary.  

## 2020-02-17 NOTE — Discharge Instructions (Signed)
Vaginal Hysterectomy, Care After Refer to this sheet in the next few weeks. These instructions provide you with information about caring for yourself after your procedure. Your health care provider may also give you more specific instructions. Your treatment has been planned according to current medical practices, but problems sometimes occur. Call your health care provider if you have any problems or questions after your procedure. What can I expect after the procedure? After the procedure, it is common to have:  Pain.  Soreness and numbness in your incision areas.  Vaginal bleeding and discharge.  Constipation.  Temporary problems emptying the bladder.  Feelings of sadness or other emotions. Follow these instructions at home: Medicines  Take over-the-counter and prescription medicines only as told by your health care provider.  If you were prescribed an antibiotic medicine, take it as told by your health care provider. Do not stop taking the antibiotic even if you start to feel better.  Do not drive or operate heavy machinery while taking prescription pain medicine. Activity  Return to your normal activities as told by your health care provider. Ask your health care provider what activities are safe for you.  Get regular exercise as told by your health care provider. You may be told to take short walks every day and go farther each time.  Do not lift anything that is heavier than 10 lb (4.5 kg). General instructions   Do not put anything in your vagina for 12 weeks after your surgery or as told by your health care provider. This includes tampons and douches.  Do not have sex until your health care provider says you can.  Do not take baths, swim, or use a hot tub until your health care provider approves.  Drink enough fluid to keep your urine clear or pale yellow.  Do not drive for 24 hours if you were given a sedative.  Keep all follow-up visits as told by your health  care provider. This is important. Contact a health care provider if:  Your pain medicine is not helping.  You have a fever.  You have redness, swelling, or pain at your incision site.  You have blood, pus, or a bad-smelling discharge from your vagina.  You continue to have difficulty urinating. Get help right away if:  You have severe abdominal or back pain.  You have heavy bleeding from your vagina.  You have chest pain or shortness of breath. This information is not intended to replace advice given to you by your health care provider. Make sure you discuss any questions you have with your health care provider. Document Revised: 07/03/2016 Document Reviewed: 11/25/2015 Elsevier Patient Education  2020 [employer].

## 2020-02-17 NOTE — Anesthesia Postprocedure Evaluation (Signed)
Anesthesia Post Note  Patient: Sherri Fitzgerald  Procedure(s) Performed: HYSTERECTOMY VAGINAL WITH SALPINGECTOMY (N/A )  Patient location during evaluation: PACU Anesthesia Type: General Level of consciousness: awake and alert Pain management: pain level controlled Vital Signs Assessment: post-procedure vital signs reviewed and stable Respiratory status: spontaneous breathing, nonlabored ventilation and respiratory function stable Cardiovascular status: blood pressure returned to baseline and stable Postop Assessment: no apparent nausea or vomiting Anesthetic complications: no     Last Vitals:  Vitals:   02/17/20 0809 02/17/20 1128  BP: 94/65 99/65  Pulse: 70 68  Resp: 18 18  Temp: 36.7 C 37 C  SpO2: 99%     Last Pain:  Vitals:   02/17/20 1128  TempSrc: Oral  PainSc:                  Tera Mater

## 2020-02-21 ENCOUNTER — Telehealth: Payer: Self-pay

## 2020-02-21 NOTE — Telephone Encounter (Signed)
Pt calling the after hour nurse 02/20/20 7:22pm; had surgery on Thursday for hyst; everything was going well on Friday; was rx'd pain and dizziness med - tramadol and odansetron on Saturday night.   Pt woke up Sunday am c a swollen face; stopped odansetron 2d ago but the swelling in her face has gotten much worse; she just broke out in hives about 35mn ago c welts all over her body including face, neck, head, and arms; right side of there face is beginning to go numb.  After hour nurse adv to call [phone]  Spoke c pt who states EMS came and gave her a benadryl which almost immediately made the welts go down.  The numbness in her face is now gone.  States her vital signs were normal.  EMS didn't think she needed to go to the hosp.  She is feeling much better and is still taking the benadryl.  States she is not taking the tramadol or odansetron but is taking Ibuprofen which is controlling her pain.

## 2020-02-22 ENCOUNTER — Encounter: Payer: Medicaid Other | Admitting: Obstetrics and Gynecology

## 2020-02-23 ENCOUNTER — Ambulatory Visit (INDEPENDENT_AMBULATORY_CARE_PROVIDER_SITE_OTHER): Payer: Medicaid Other | Admitting: Obstetrics and Gynecology

## 2020-02-23 ENCOUNTER — Encounter: Payer: Self-pay | Admitting: Obstetrics and Gynecology

## 2020-02-23 ENCOUNTER — Other Ambulatory Visit: Payer: Self-pay

## 2020-02-23 ENCOUNTER — Other Ambulatory Visit: Payer: Medicaid Other

## 2020-02-23 VITALS — BP 104/80 | Ht 66.0 in | Wt 151.0 lb

## 2020-02-23 DIAGNOSIS — Z48816 Encounter for surgical aftercare following surgery on the genitourinary system: Secondary | ICD-10-CM

## 2020-02-23 DIAGNOSIS — Z90710 Acquired absence of both cervix and uterus: Secondary | ICD-10-CM

## 2020-02-23 DIAGNOSIS — R141 Gas pain: Secondary | ICD-10-CM

## 2020-02-23 MED ORDER — SIMETHICONE 80 MG PO CHEW: 80.0000 mg | CHEWABLE_TABLET | Freq: Four times a day (QID) | ORAL | 0 refills | Status: DC | PRN

## 2020-02-23 NOTE — Progress Notes (Signed)
  Postoperative Follow-up Patient presents post op from vaginal hysterectomy for abnormal uterine bleeding, 1 week ago.  Subjective: Patient reports some improvement in her preop symptoms. Eating a regular diet without difficulty. Pain is controlled with current analgesics. Medications being used: ibuprofen (OTC).  Activity: normal activities of daily living. Patient reports additional symptom's since surgery of Abd pain. Had an allergic reaction to tramadol or zofran with hives, itching, and face swelling.   Objective: BP 104/80   Ht 5\' 6"  (1.676 m)   Wt 151 lb (68.5 kg)   LMP 01/23/2020 (Exact Date)   BMI 24.37 kg/m  Physical Exam Constitutional:      Appearance: She is well-developed.  Genitourinary:     Vagina and uterus normal.     No lesions in the vagina.     No cervical motion tenderness.     No right or left adnexal mass present.  HENT:     Head: Normocephalic and atraumatic.  Neck:     Thyroid: No thyromegaly.  Cardiovascular:     Rate and Rhythm: Normal rate and regular rhythm.     Heart sounds: Normal heart sounds.  Pulmonary:     Effort: Pulmonary effort is normal.     Breath sounds: Normal breath sounds.  Chest:     Breasts:        Right: No inverted nipple, mass, nipple discharge or skin change.        Left: No inverted nipple, mass, nipple discharge or skin change.  Abdominal:     General: Bowel sounds are normal. There is no distension.     Palpations: Abdomen is soft. There is no mass.  Musculoskeletal:     Cervical back: Neck supple.  Neurological:     Mental Status: She is alert and oriented to person, place, and time.  Skin:    General: Skin is warm and dry.  Psychiatric:        Behavior: Behavior normal.        Thought Content: Thought content normal.        Judgment: Judgment normal.  Vitals reviewed.     Assessment: s/p :  vaginal hysterectomy stable  Plan: Patient has done well after surgery with no apparent complications.  I have  discussed the post-operative course to date, and the expected progress moving forward.  The patient understands what complications to be concerned about.  I will see the patient in routine follow up, or sooner if needed.    RX sent for mylicon  Activity plan: No heavy lifting. Pelvic rest.  Kahlel Peake R Manfred Laspina 02/23/2020, 10:08 AM

## 2020-02-28 ENCOUNTER — Other Ambulatory Visit: Payer: Medicaid Other

## 2020-03-02 ENCOUNTER — Other Ambulatory Visit: Payer: Medicaid Other

## 2020-03-16 ENCOUNTER — Inpatient Hospital Stay: Payer: Medicaid Other

## 2020-03-20 ENCOUNTER — Inpatient Hospital Stay: Payer: Medicaid Other | Attending: Hematology and Oncology

## 2020-03-20 ENCOUNTER — Other Ambulatory Visit: Payer: Self-pay

## 2020-03-20 DIAGNOSIS — D509 Iron deficiency anemia, unspecified: Secondary | ICD-10-CM | POA: Insufficient documentation

## 2020-03-20 LAB — CBC
HCT: 41.9 % (ref 36.0–46.0)
Hemoglobin: 13.3 g/dL (ref 12.0–15.0)
MCH: 28.6 pg (ref 26.0–34.0)
MCHC: 31.7 g/dL (ref 30.0–36.0)
MCV: 90.1 fL (ref 80.0–100.0)
Platelets: 299 10*3/uL (ref 150–400)
RBC: 4.65 MIL/uL (ref 3.87–5.11)
RDW: 14.4 % (ref 11.5–15.5)
WBC: 4.9 10*3/uL (ref 4.0–10.5)
nRBC: 0 % (ref 0.0–0.2)

## 2020-03-20 LAB — FERRITIN: Ferritin: 40 ng/mL (ref 11–307)

## 2020-03-30 ENCOUNTER — Ambulatory Visit: Payer: Medicaid Other | Admitting: Obstetrics and Gynecology

## 2020-04-06 ENCOUNTER — Ambulatory Visit (INDEPENDENT_AMBULATORY_CARE_PROVIDER_SITE_OTHER): Payer: Medicaid Other | Admitting: Obstetrics and Gynecology

## 2020-04-06 ENCOUNTER — Encounter: Payer: Self-pay | Admitting: Obstetrics and Gynecology

## 2020-04-06 ENCOUNTER — Other Ambulatory Visit: Payer: Self-pay

## 2020-04-06 VITALS — BP 90/60 | Ht 66.0 in | Wt 154.0 lb

## 2020-04-06 DIAGNOSIS — Z90710 Acquired absence of both cervix and uterus: Secondary | ICD-10-CM

## 2020-04-06 NOTE — Progress Notes (Signed)
  Postoperative Follow-up Patient presents post op from vaginal hysterectomy for abnormal uterine bleeding, 7 weeks ago.  Subjective: Patient reports marked improvement in her preop symptoms. Eating a regular diet without difficulty. The patient is not having any pain.  Activity: normal activities of daily living. Patient reports additional symptom's since surgery of None.  Objective: BP 90/60   Ht 5\' 6"  (1.676 m)   Wt 154 lb (69.9 kg)   LMP 01/23/2020 (Exact Date)   BMI 24.86 kg/m  Physical Exam Constitutional:      Appearance: She is well-developed.  Genitourinary:     Vagina normal.     No lesions in the vagina.     Uterus is absent.     Genitourinary Comments: Vaginal cuff well healed, intact. No bleeding.   HENT:     Head: Normocephalic and atraumatic.  Neck:     Thyroid: No thyromegaly.  Cardiovascular:     Rate and Rhythm: Normal rate and regular rhythm.     Heart sounds: Normal heart sounds.  Pulmonary:     Effort: Pulmonary effort is normal.     Breath sounds: Normal breath sounds.  Chest:     Breasts:        Right: No inverted nipple, mass, nipple discharge or skin change.        Left: No inverted nipple, mass, nipple discharge or skin change.  Abdominal:     General: Bowel sounds are normal. There is no distension.     Palpations: Abdomen is soft. There is no mass.  Musculoskeletal:     Cervical back: Neck supple.  Neurological:     Mental Status: She is alert and oriented to person, place, and time.  Skin:    General: Skin is warm and dry.  Psychiatric:        Behavior: Behavior normal.        Thought Content: Thought content normal.        Judgment: Judgment normal.  Vitals reviewed.   Assessment: s/p :  vaginal hysterectomy stable  Plan: Patient has done well after surgery with no apparent complications.  I have discussed the post-operative course to date, and the expected progress moving forward.  The patient understands what complications to be  concerned about.  I will see the patient in routine follow up, or sooner if needed.    Activity plan: No restriction.  Sherri Fitzgerald 04/06/2020, 10:32 AM

## 2020-07-18 ENCOUNTER — Other Ambulatory Visit: Payer: Medicaid Other

## 2020-07-19 ENCOUNTER — Ambulatory Visit: Payer: Medicaid Other | Admitting: Hematology and Oncology

## 2020-07-19 ENCOUNTER — Ambulatory Visit: Payer: Medicaid Other
